# Patient Record
Sex: Male | Born: 1970 | Race: White | Hispanic: No | Marital: Married | Smoking: Former smoker
Health system: Southern US, Community
[De-identification: ages and names within clinical notes are randomized; demographics above are authoritative.]

## PROBLEM LIST (undated history)

## (undated) DIAGNOSIS — K219 Gastro-esophageal reflux disease without esophagitis: Secondary | ICD-10-CM

## (undated) DIAGNOSIS — Z87442 Personal history of urinary calculi: Secondary | ICD-10-CM

## (undated) DIAGNOSIS — G894 Chronic pain syndrome: Secondary | ICD-10-CM

## (undated) HISTORY — PX: LUMBAR DISC SURGERY: SHX700

## (undated) HISTORY — PX: HEMILAMINOTOMY LUMBAR SPINE: SUR654

## (undated) HISTORY — PX: HERNIA REPAIR: SHX51

---

## 2004-03-17 ENCOUNTER — Other Ambulatory Visit: Payer: Self-pay

## 2012-10-06 ENCOUNTER — Ambulatory Visit: Payer: Self-pay

## 2012-10-06 LAB — CBC WITH DIFFERENTIAL/PLATELET
Basophil #: 0 10*3/uL (ref 0.0–0.1)
Basophil %: 0.4 %
Eosinophil #: 0 10*3/uL (ref 0.0–0.7)
HCT: 44.5 % (ref 40.0–52.0)
Lymphocyte #: 1.3 10*3/uL (ref 1.0–3.6)
MCHC: 34.2 g/dL (ref 32.0–36.0)
Monocyte #: 1.5 x10 3/mm — ABNORMAL HIGH (ref 0.2–1.0)
Monocyte %: 13.1 %
Neutrophil #: 8.6 10*3/uL — ABNORMAL HIGH (ref 1.4–6.5)
Platelet: 175 10*3/uL (ref 150–440)
RDW: 13 % (ref 11.5–14.5)
WBC: 11.5 10*3/uL — ABNORMAL HIGH (ref 3.8–10.6)

## 2012-10-06 LAB — URINALYSIS, COMPLETE
Bilirubin,UR: NEGATIVE
Glucose,UR: NEGATIVE mg/dL (ref 0–75)
Ketone: NEGATIVE
Leukocyte Esterase: NEGATIVE
Ph: 6 (ref 4.5–8.0)
Protein: 30
RBC,UR: >30 /HPF (ref 0–5)

## 2012-10-06 LAB — COMPREHENSIVE METABOLIC PANEL
Albumin: 4 g/dL (ref 3.4–5.0)
Anion Gap: 12 (ref 7–16)
BUN: 10 mg/dL (ref 7–18)
Bilirubin,Total: 0.7 mg/dL (ref 0.2–1.0)
Chloride: 101 mmol/L (ref 98–107)
Creatinine: 1.17 mg/dL (ref 0.60–1.30)
EGFR (African American): >60
Glucose: 127 mg/dL — ABNORMAL HIGH (ref 65–99)
Osmolality: 278 (ref 275–301)
Potassium: 4 mmol/L (ref 3.5–5.1)
Sodium: 139 mmol/L (ref 136–145)
Total Protein: 7.9 g/dL (ref 6.4–8.2)

## 2012-10-06 LAB — AMYLASE: Amylase: 27 U/L (ref 25–115)

## 2012-10-07 ENCOUNTER — Emergency Department: Payer: Self-pay | Admitting: Emergency Medicine

## 2012-10-07 LAB — CBC WITH DIFFERENTIAL/PLATELET
Basophil #: 0 10*3/uL (ref 0.0–0.1)
Basophil %: 0.2 %
Eosinophil %: 0 %
HCT: 37.7 % — ABNORMAL LOW (ref 40.0–52.0)
Lymphocyte #: 1.2 10*3/uL (ref 1.0–3.6)
MCH: 32 pg (ref 26.0–34.0)
MCV: 89 fL (ref 80–100)
Monocyte %: 10.6 %
Neutrophil #: 7.6 10*3/uL — ABNORMAL HIGH (ref 1.4–6.5)
Platelet: 155 10*3/uL (ref 150–440)
RBC: 4.24 10*6/uL — ABNORMAL LOW (ref 4.40–5.90)

## 2012-10-07 LAB — COMPREHENSIVE METABOLIC PANEL
Anion Gap: 8 (ref 7–16)
Bilirubin,Total: 0.7 mg/dL (ref 0.2–1.0)
Chloride: 101 mmol/L (ref 98–107)
Co2: 24 mmol/L (ref 21–32)
Creatinine: 1.02 mg/dL (ref 0.60–1.30)
EGFR (African American): >60
EGFR (Non-African Amer.): >60
Osmolality: 268 (ref 275–301)
Potassium: 3.7 mmol/L (ref 3.5–5.1)
SGOT(AST): 32 U/L (ref 15–37)
SGPT (ALT): 42 U/L (ref 12–78)

## 2012-10-08 LAB — URINE CULTURE

## 2012-10-14 ENCOUNTER — Ambulatory Visit: Payer: Self-pay

## 2013-02-11 ENCOUNTER — Ambulatory Visit: Payer: Self-pay | Admitting: Family Medicine

## 2013-02-14 LAB — BETA STREP CULTURE(ARMC)

## 2013-04-03 IMAGING — CT CT STONE STUDY
1 of 2 series · 15 of 32 positions shown, 19 images · non-contrast
Comparison: none

REASON FOR EXAM: RLQ pain, hematuria, hx stone
COMMENTS:

[Series 2: soft tissue · axial · 0.69mm/px · z∈[-514,-64]mm · 15 of 164 slices shown, 19 images]
[im 7/164  soft-tissue]
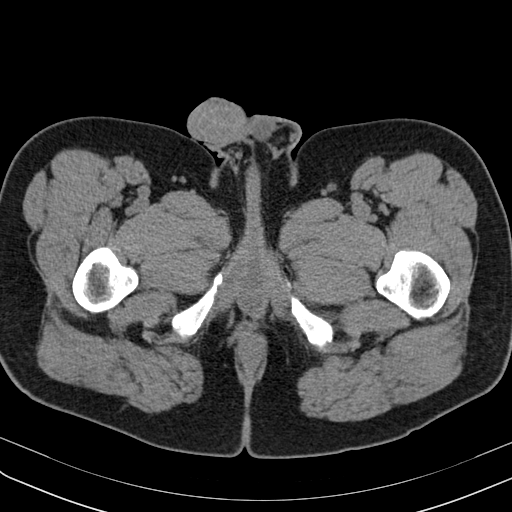
[im 7/164  bone]
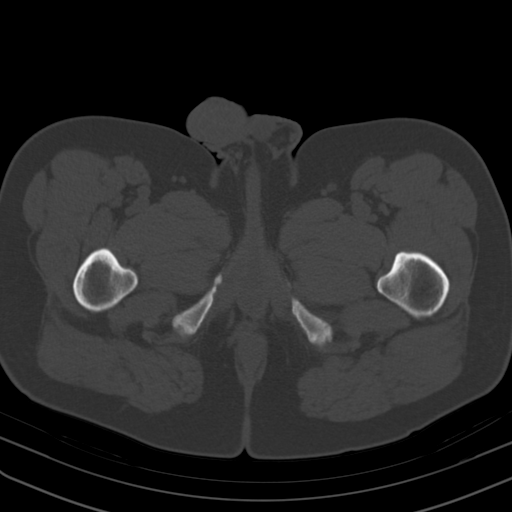
[im 21/164  soft-tissue]
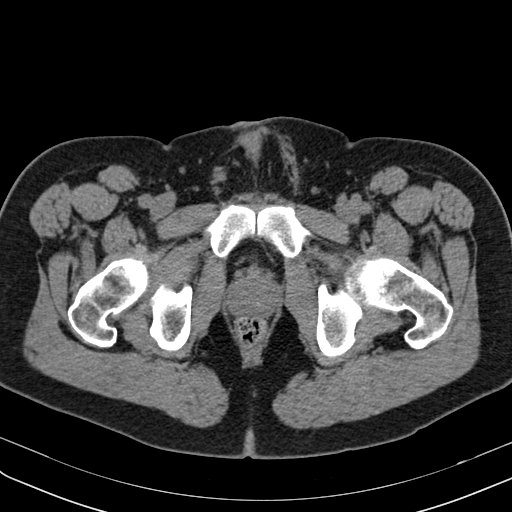
[im 34/164  soft-tissue]
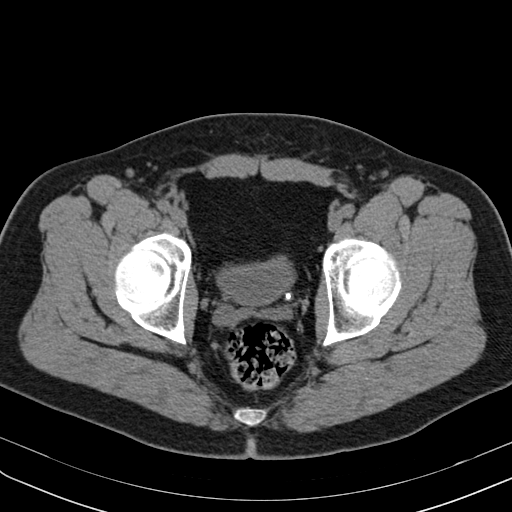
[im 48/164  soft-tissue]
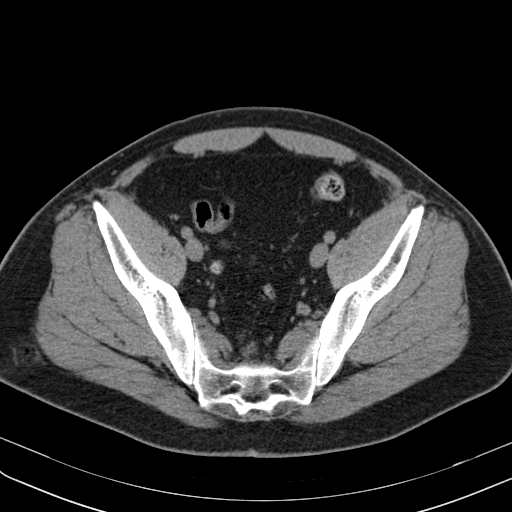
[im 55/164  soft-tissue]
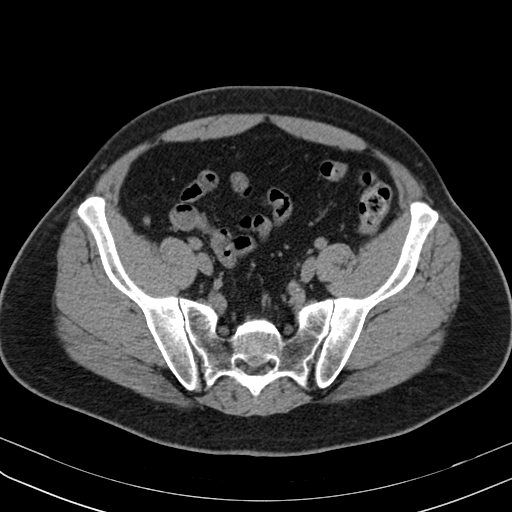
[im 68/164  soft-tissue]
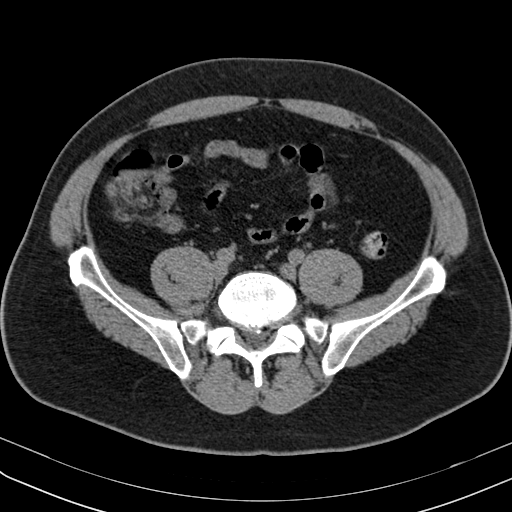
[im 82/164  soft-tissue]
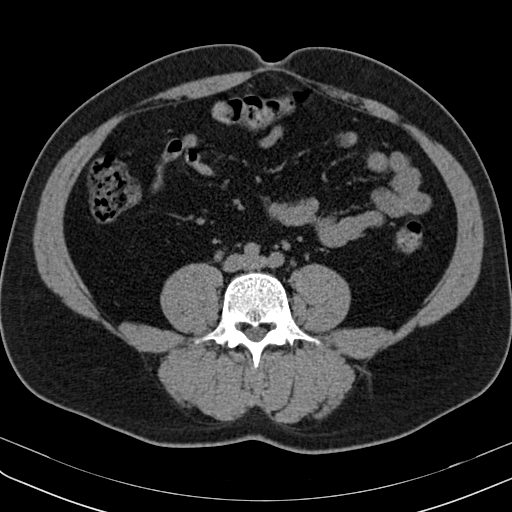
[im 96/164  soft-tissue]
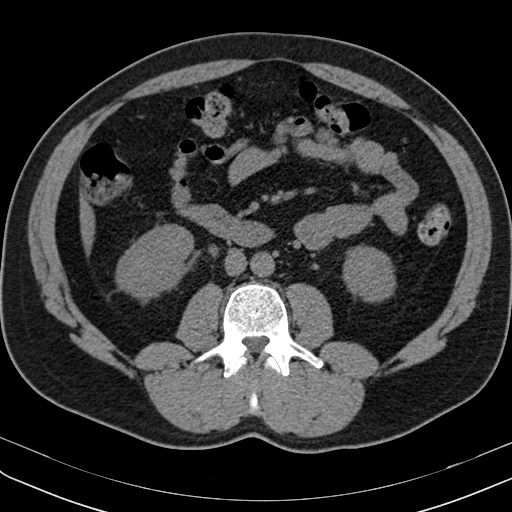
[im 109/164  soft-tissue]
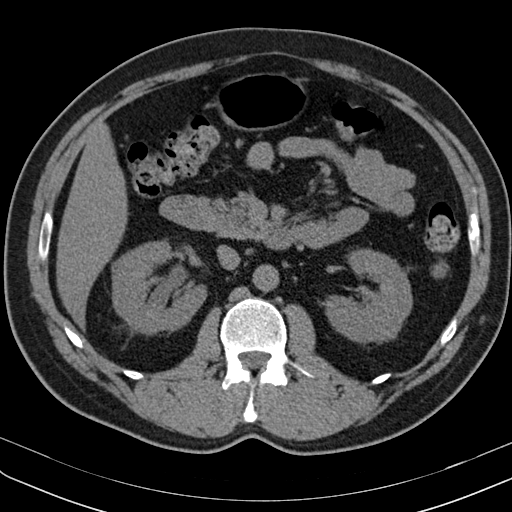
[im 109/164  bone]
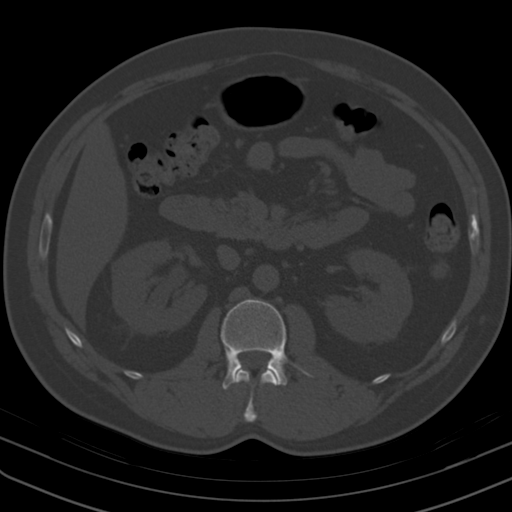
[im 116/164  soft-tissue]
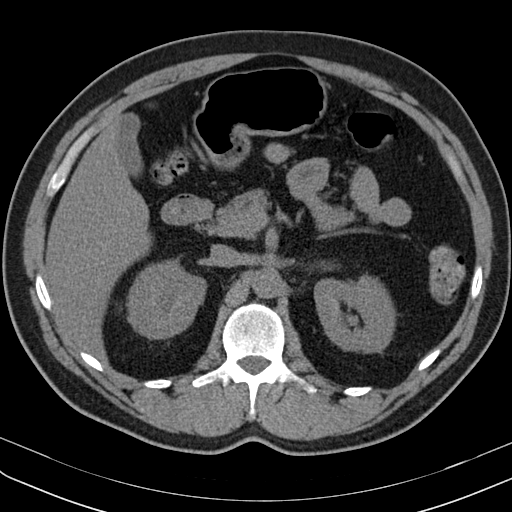
[im 130/164  soft-tissue]
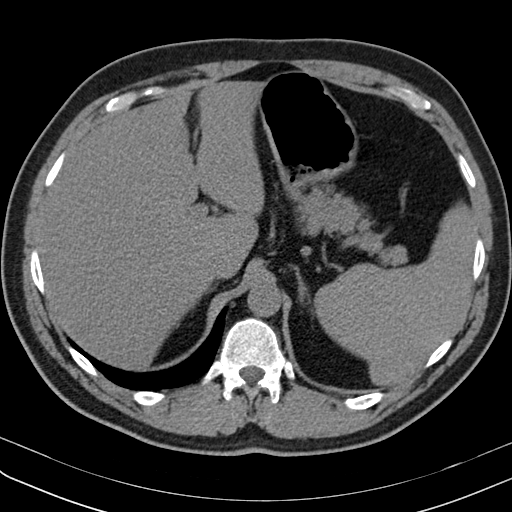
[im 136/164  lung]
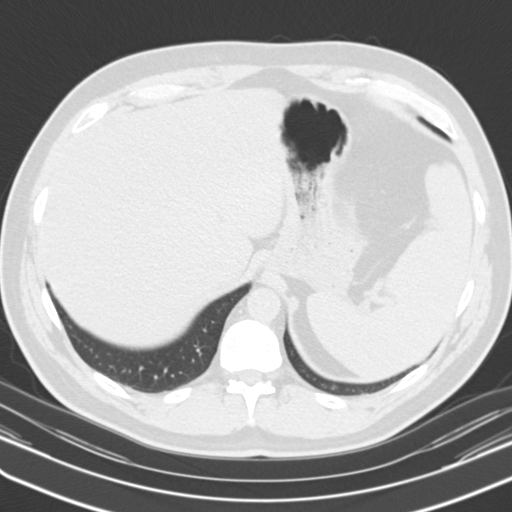
[im 143/164  soft-tissue]
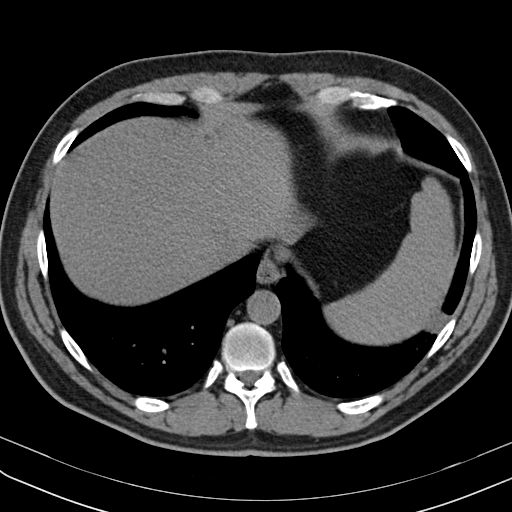
[im 143/164  lung]
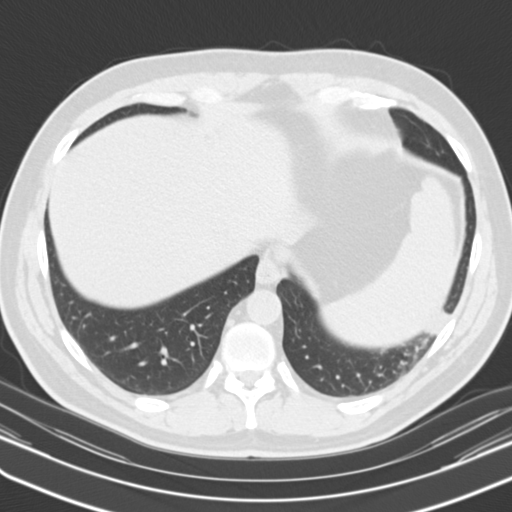
[im 150/164  lung]
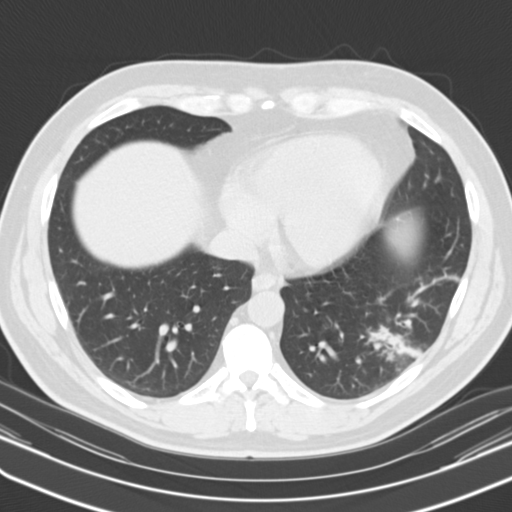
[im 157/164  soft-tissue]
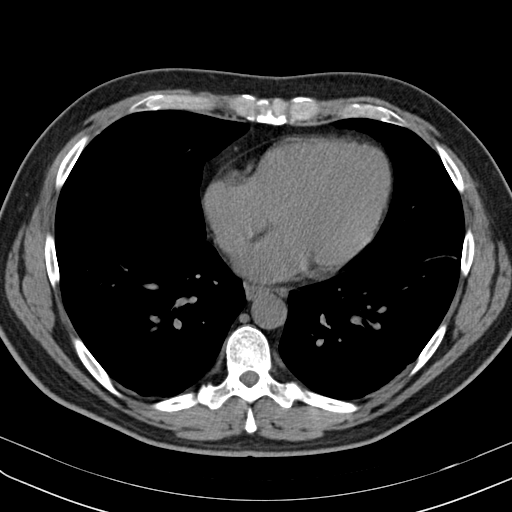
[im 157/164  lung]
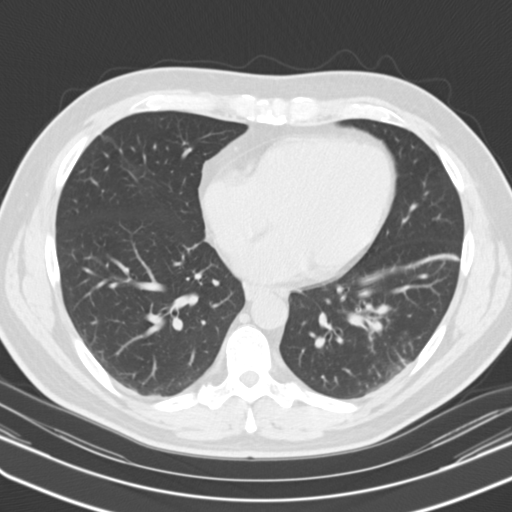

[15 of 32 positions shown; findings below may reference images not displayed]

PROCEDURE:     CHARPANTIER - CHARPANTIER ABDOMEN/PELVIS WO ( STONE)  - October 06, 2012  [DATE]

RESULT:     Axial noncontrast CT scanning was performed through the abdomen
and pelvis with reconstructions at 3 mm intervals and slice thicknesses.
Review of multiplanar reconstructed images was performed separately on the
VIA monitor.

There is mild hydronephrosis on the right secondary to a stone in the mid
ureter measuring 4 mm in diameter. There are nonobstructing 3 to 5 mm
diameter mid and lower pole stones elsewhere on the right. The perinephric
fat is normal in appearance. On the left there is no hydronephrosis. There
are stones measuring approximately 4 mm in diameter in the midpole. The
partially distended urinary bladder is normal in appearance. There is a
nonobstructing approximately 3 mm diameter stone at the left ureterovesical
junction. The prostate gland and seminal vesicles are normal in appearance.

The liver, gallbladder, pancreas, partially distended stomach, adrenal
glands, and periaortic and pericaval regions are normal in appearance. The
spleen is mildly enlarged measuring 15.9 cm AP x 10.5 cm transversely x 9 cm
in longitudinal dimension. The unopacified loops of small and large bowel
exhibit no evidence of ileus nor obstruction. A normal calibered appendix is
demonstrated.

The left lung base reveals patchy density suggesting atelectasis or
pneumonia. The lumbar vertebral bodies are preserved in height.
IMPRESSION: 1. There is mild hydronephrosis on the right secondary to a 4 mm diameter
stone at the junction of the proximal and mid ureter. There are other
similar size stones elsewhere in the right kidney not producing obstruction.
There are nonobstructing stones in the left kidney. There is a
nonobstructing 3 mm diameter left ureterovesical junction stone.
2. There is no evidence of acute hepatobiliary abnormality. There is
splenomegaly.
3. There is no acute bowel abnormality.
4. Patchy density in the lower lobe of the left lung laterally suggests
atelectasis or pneumonia.

[REDACTED]

## 2013-04-11 IMAGING — CR DG CHEST 2V
1 series · 2 of 2 positions shown · non-contrast
Comparison: none

REASON FOR EXAM: cough
COMMENTS:

PROCEDURE:     MDR - MDR CHEST PA(OR AP) AND LATERAL  - October 14, 2012  [DATE]
RESULT:     The lungs are clear. The heart and pulmonary vessels are normal.
The bony and mediastinal structures are unremarkable. There is no effusion.
There is no pneumothorax or evidence of congestive failure.

[Series 1: pa · 0.17mm/px · 2 of 2 slices shown]
[im 1/2]
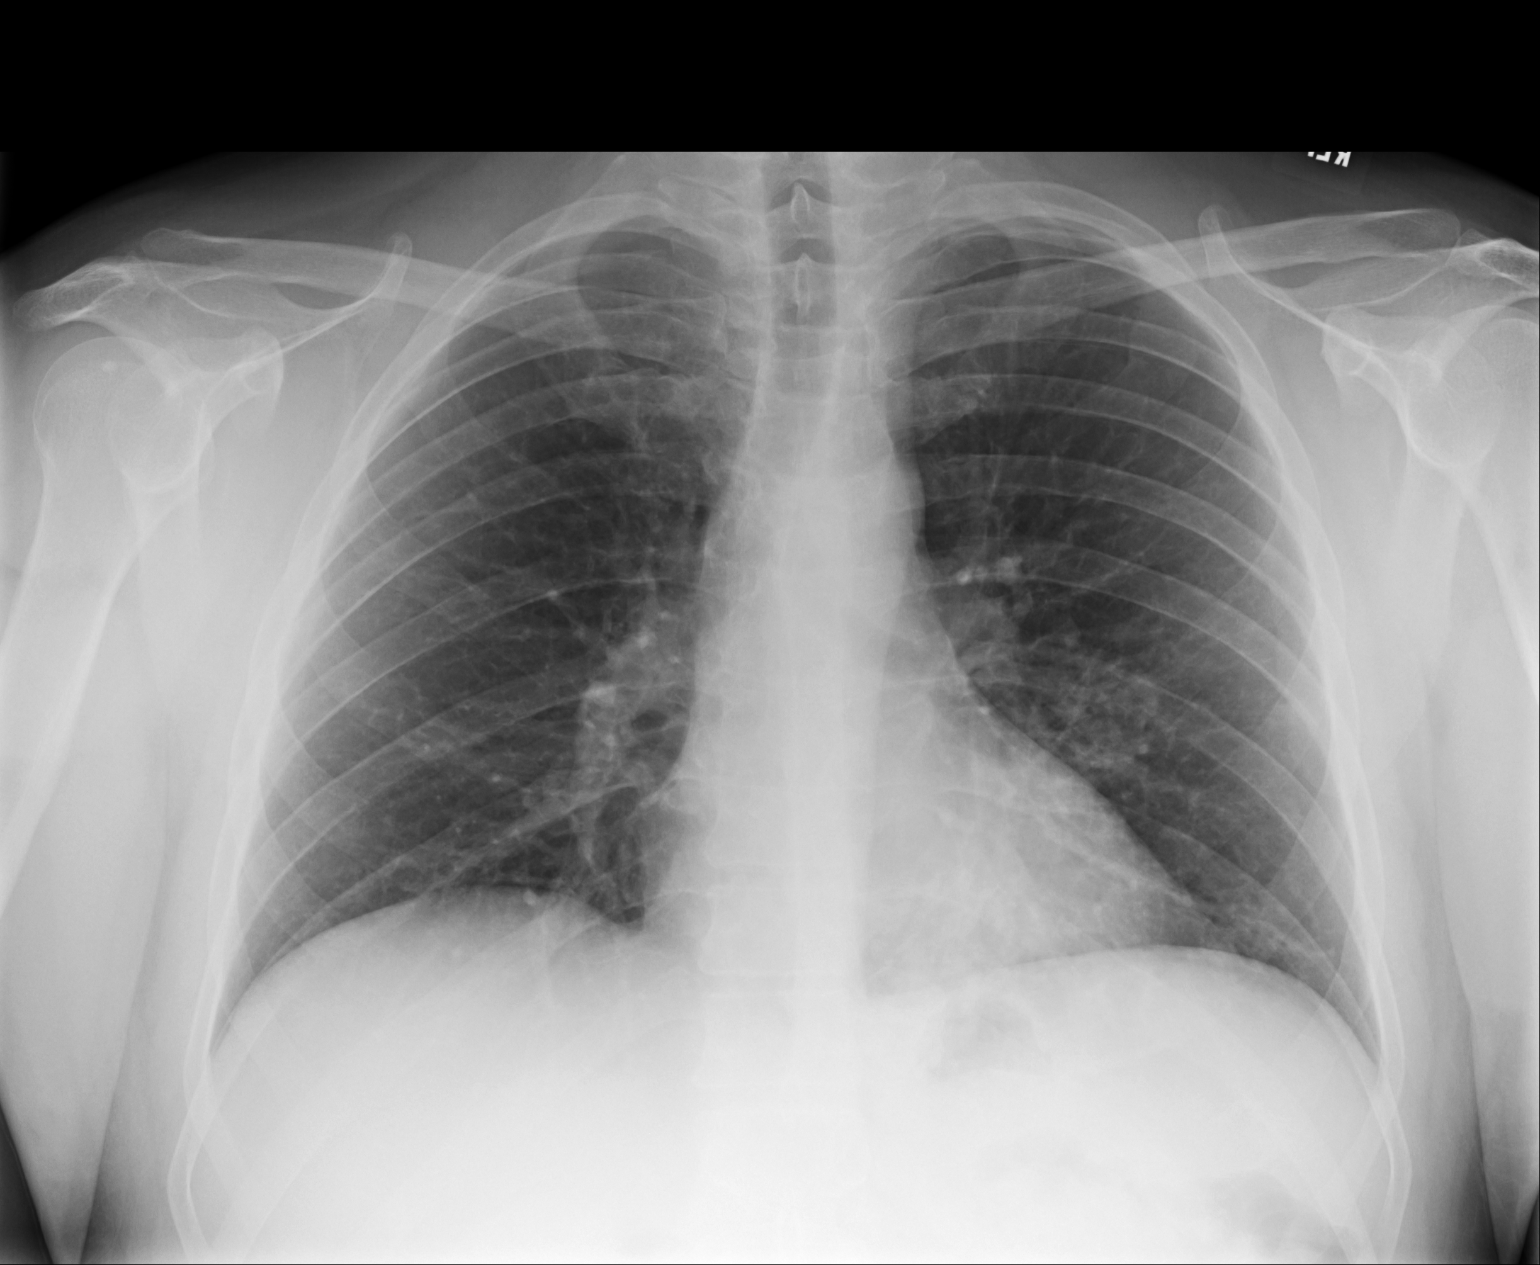
[im 2/2]
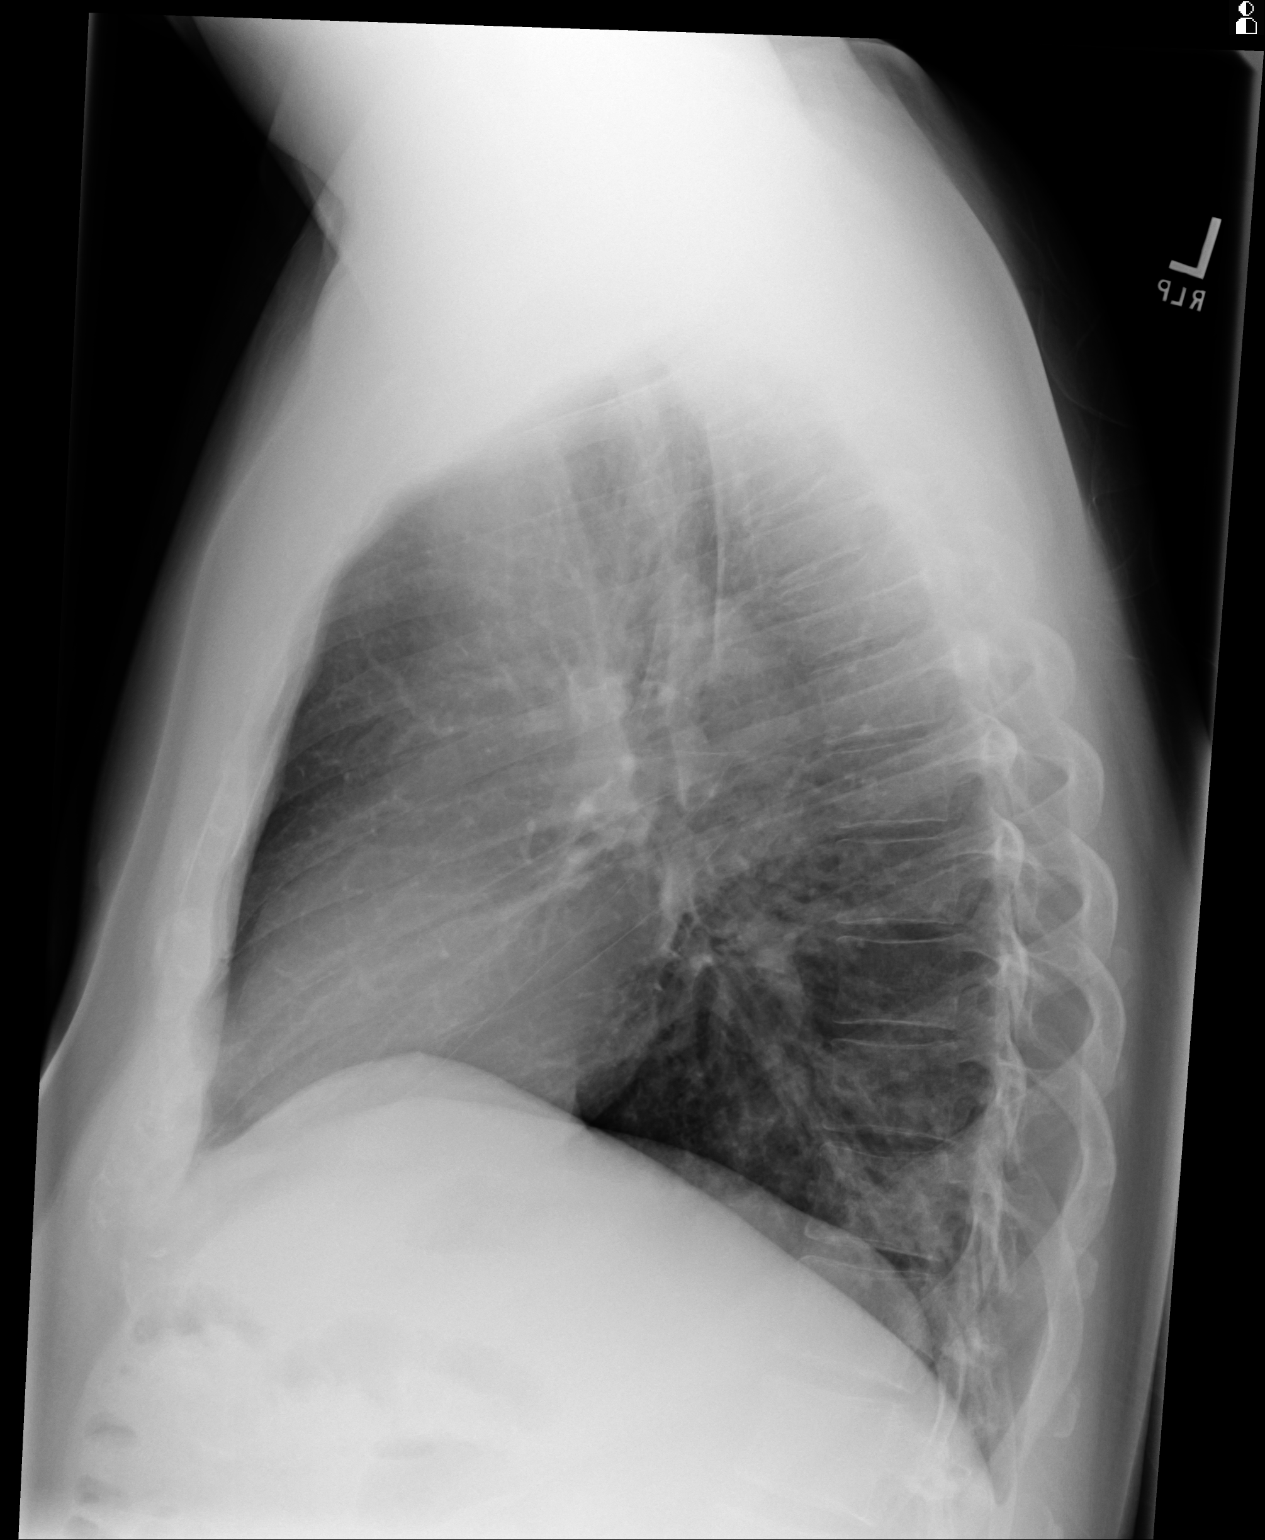

[2 of 2 positions shown; findings below may reference images not displayed]

IMPRESSION: No acute cardiopulmonary disease.

[REDACTED]

## 2014-04-26 ENCOUNTER — Ambulatory Visit: Payer: Self-pay | Admitting: Internal Medicine

## 2014-12-24 ENCOUNTER — Ambulatory Visit: Payer: Self-pay | Admitting: Physician Assistant

## 2015-04-21 ENCOUNTER — Ambulatory Visit (INDEPENDENT_AMBULATORY_CARE_PROVIDER_SITE_OTHER): Payer: 59 | Admitting: Unknown Physician Specialty

## 2015-04-21 ENCOUNTER — Encounter: Payer: Self-pay | Admitting: Unknown Physician Specialty

## 2015-04-21 VITALS — BP 134/83 | HR 71 | Temp 98.3°F | Ht 68.0 in | Wt 189.2 lb

## 2015-04-21 DIAGNOSIS — G894 Chronic pain syndrome: Secondary | ICD-10-CM | POA: Diagnosis not present

## 2015-04-21 DIAGNOSIS — F32A Depression, unspecified: Secondary | ICD-10-CM

## 2015-04-21 DIAGNOSIS — F329 Major depressive disorder, single episode, unspecified: Secondary | ICD-10-CM | POA: Diagnosis not present

## 2015-04-21 DIAGNOSIS — F419 Anxiety disorder, unspecified: Secondary | ICD-10-CM

## 2015-04-21 DIAGNOSIS — G8929 Other chronic pain: Secondary | ICD-10-CM | POA: Insufficient documentation

## 2015-04-21 DIAGNOSIS — M549 Dorsalgia, unspecified: Secondary | ICD-10-CM | POA: Diagnosis not present

## 2015-04-21 NOTE — Patient Instructions (Signed)
[employer].  Put in zip code and insurance.

## 2015-04-21 NOTE — Assessment & Plan Note (Signed)
Feeling hopeless.  Not ready for counseling at this time but we discussed it.

## 2015-04-21 NOTE — Assessment & Plan Note (Addendum)
Continue present medications.  Refer to Dr. Dossie Arbour as pt would like to consider other options including another opinion about a spinal cord stimulator

## 2015-04-21 NOTE — Progress Notes (Addendum)
BP 134/83 mmHg  Pulse 71  Temp(Src) 98.3 F (36.8 C)  Ht 5\' 8"  (1.727 m)  Wt 189 lb 3.2 oz (85.821 kg)  BMI 28.77 kg/m2  SpO2 98%   Subjective:    Patient ID: William Herring, male    DOB: July 06, 1971, 44 y.o.   MRN: [id number]  HPI: William Herring is a 44 y.o. male  Chief Complaint  Patient presents with  . Pain    Relevant past medical, surgical, family and social history reviewed and updated as indicated. Interim medical history since our last visit reviewed. Allergies and medications reviewed and updated.  Patient requesting refill of pain medication. Has been treated by [facility] in [place].  His is staus post Hemilaminectomy and discectomy about 1 year ago.  He currently reports pain radiating from buttocks to the back of his knee. Describes it as a sharp burning pain.  He is also taking Gabapentin which helps to "dull it."  He is not sure of the dose.  In addition, he takes Vicoden 7.5mg /300 TID.  Has a temporary spinal cord stimulator which hs helped some. Nerve pain in leg is better.   Discussed with pain clinic a permanent spinal cord stimular which patient is stil "thinking about."  He  Doesn't want to be out of work for 6 weeks.    Depression: Pt reports complete lack of motivation.  Probably secondary to pain.  Pt is not open to counseling at this time.    Review of Systems  Per HPI unless specifically indicated above     Objective:    BP 134/83 mmHg  Pulse 71  Temp(Src) 98.3 F (36.8 C)  Ht 5\' 8"  (1.727 m)  Wt 189 lb 3.2 oz (85.821 kg)  BMI 28.77 kg/m2  SpO2 98%  Wt Readings from Last 3 Encounters:  04/21/15 189 lb 3.2 oz (85.821 kg)  02/03/15 179 lb (81.194 kg)    Physical Exam  Constitutional: He is oriented to person, place, and time. He appears well-developed and well-nourished. No distress.  HENT:  Head: Normocephalic and atraumatic.  Eyes: Conjunctivae and lids are normal. Right eye exhibits no discharge. Left eye exhibits  no discharge. No scleral icterus.  Cardiovascular: Normal rate and regular rhythm.   Pulmonary/Chest: Effort normal. No respiratory distress.  Abdominal: Normal appearance. He exhibits no distension. There is no splenomegaly or hepatomegaly. There is no tenderness.  Musculoskeletal: Normal range of motion.  Neurological: He is alert and oriented to person, place, and time.  Skin: Skin is intact. No rash noted. No pallor.  Psychiatric: He has a normal mood and affect. His behavior is normal. Judgment and thought content normal.       Assessment & Plan:   Problem List Items Addressed This Visit      Other   Chronic back pain - Primary    Continue present medications.  Refer to Dr. Dossie Arbour as pt would like to consider other options including another opinion about a spinal cord stimulator      Relevant Orders   Ambulatory referral to Pain Clinic   Chronic pain syndrome   Relevant Orders   Ambulatory referral to Pain Clinic   Anxiety   Depression    Feeling hopeless.  Not ready for counseling at this time but we discussed it.          Pt is on a 28 day cycle for his pain medications.    Follow up plan: Return if symptoms worsen or  fail to improve.

## 2015-04-23 ENCOUNTER — Other Ambulatory Visit: Payer: Self-pay | Admitting: Unknown Physician Specialty

## 2015-04-23 MED ORDER — HYDROCODONE-ACETAMINOPHEN 7.5-325 MG PO TABS: 1.0000 | ORAL_TABLET | Freq: Three times a day (TID) | ORAL | Status: DC

## 2015-04-28 ENCOUNTER — Other Ambulatory Visit: Payer: Self-pay | Admitting: Unknown Physician Specialty

## 2015-04-28 MED ORDER — CLONAZEPAM 0.5 MG PO TABS: 0.5000 mg | ORAL_TABLET | Freq: Every day | ORAL | Status: DC

## 2015-05-21 ENCOUNTER — Other Ambulatory Visit: Payer: Self-pay | Admitting: Unknown Physician Specialty

## 2015-05-21 MED ORDER — HYDROCODONE-ACETAMINOPHEN 7.5-325 MG PO TABS: 1.0000 | ORAL_TABLET | Freq: Three times a day (TID) | ORAL | Status: DC

## 2015-05-25 ENCOUNTER — Ambulatory Visit
Admission: EM | Admit: 2015-05-25 | Discharge: 2015-05-25 | Disposition: A | Discharge status: Home / Self Care | Payer: 59

## 2015-05-25 NOTE — ED Notes (Signed)
Pt. Requesting "Cortisone injection" as had done here previously. Informed that neither [facility] feels comfortable injecting the patient and instructed to follow up with Primary Care doctor

## 2015-05-26 ENCOUNTER — Other Ambulatory Visit: Payer: Self-pay | Admitting: Unknown Physician Specialty

## 2015-05-26 MED ORDER — CITALOPRAM HYDROBROMIDE 20 MG PO TABS: 20.0000 mg | ORAL_TABLET | Freq: Every day | ORAL | Status: DC

## 2015-05-26 MED ORDER — CLONAZEPAM 0.5 MG PO TABS: 0.5000 mg | ORAL_TABLET | Freq: Every day | ORAL | Status: DC

## 2015-05-31 ENCOUNTER — Ambulatory Visit (INDEPENDENT_AMBULATORY_CARE_PROVIDER_SITE_OTHER): Payer: 59 | Admitting: Unknown Physician Specialty

## 2015-05-31 ENCOUNTER — Encounter: Payer: Self-pay | Admitting: Unknown Physician Specialty

## 2015-05-31 VITALS — BP 116/80 | HR 73 | Temp 98.6°F | Ht 68.5 in | Wt 185.6 lb

## 2015-05-31 DIAGNOSIS — M7711 Lateral epicondylitis, right elbow: Secondary | ICD-10-CM | POA: Diagnosis not present

## 2015-05-31 MED ORDER — METHYLPREDNISOLONE 4 MG PO TBPK: ORAL_TABLET | ORAL | Status: DC

## 2015-05-31 MED ORDER — DICLOFENAC SODIUM 1 % TD GEL: 4.0000 g | Freq: Four times a day (QID) | TRANSDERMAL | Status: DC

## 2015-05-31 NOTE — Patient Instructions (Signed)
Tennis Elbow Your caregiver has diagnosed you with a condition often referred to as "tennis elbow." This results from small tears or soreness (inflammation) at the start (origin) of the extensor muscles of the forearm. Although the condition is often called tennis or golfer's elbow, it is caused by any repetitive action performed by your elbow. HOME CARE INSTRUCTIONS  If the condition has been short lived, rest may be the only treatment required. Using your opposite hand or arm to perform the task may help. Even changing your grip may help rest the extremity. These may even prevent the condition from recurring.  Longer standing problems, however, will often be relieved faster by:  Using anti-inflammatory agents.  Applying ice packs for 30 minutes at the end of the working day, at bed time, or when activities are finished.  Your caregiver may also have you wear a splint or sling. This will allow the inflamed tendon to heal. At times, steroid injections aided with a local anesthetic will be required along with splinting for 1 to 2 weeks. Two to three steroid injections will often solve the problem. In some long standing cases, the inflamed tendon does not respond to conservative (non-surgical) therapy. Then surgery may be required to repair it. MAKE SURE YOU:   Understand these instructions.  Will watch your condition.  Will get help right away if you are not doing well or get worse. Document Released: 10/23/2005 Document Revised: 01/15/2012 Document Reviewed: 06/10/2008 Exit[employer] Patient Information 2015 [employer]. This information is not intended to replace advice given to you by your health care provider. Make sure you discuss any questions you have with your health care provider.  

## 2015-05-31 NOTE — Progress Notes (Signed)
   BP 116/80 mmHg  Pulse 73  Temp(Src) 98.6 F (37 C)  Ht 5' 8.5" (1.74 m)  Wt 185 lb 9.6 oz (84.188 kg)  BMI 27.81 kg/m2  SpO2 96%   Subjective:    Patient ID: William Herring, male    DOB: December 31, 1970, 44 y.o.   MRN: [id number]  HPI: William Herring is a 44 y.o. male  Chief Complaint  Patient presents with  . Arm Pain    pt states elbow on right arm is in pain. Pt states he got a cortizone injection back in February and has not had any problems since then until now   HPI   Elbow pain Pain has been ongoing for 6 months.  Went to urgent care 5 months ago.  They gave him a cortisone injection.  He started having trouble with it 2 weeks ago.  No contributing, worsening , or alleviating factors.  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    Relevant past medical, surgical, family and social history reviewed and updated as indicated. Interim medical history since our last visit reviewed. Allergies and medications reviewed and updated.  Review of Systems  Per HPI unless specifically indicated above     Objective:    BP 116/80 mmHg  Pulse 73  Temp(Src) 98.6 F (37 C)  Ht 5' 8.5" (1.74 m)  Wt 185 lb 9.6 oz (84.188 kg)  BMI 27.81 kg/m2  SpO2 96%  Wt Readings from Last 3 Encounters:  05/31/15 185 lb 9.6 oz (84.188 kg)  04/21/15 189 lb 3.2 oz (85.821 kg)  02/03/15 179 lb (81.194 kg)    Physical Exam  Constitutional: He is oriented to person, place, and time. He appears well-developed and well-nourished. No distress.  HENT:  Head: Normocephalic and atraumatic.  Eyes: Conjunctivae and lids are normal. Right eye exhibits no discharge. Left eye exhibits no discharge. No scleral icterus.  Pulmonary/Chest: Effort normal. No respiratory distress.  Abdominal: Normal appearance. There is no splenomegaly or hepatomegaly.  Musculoskeletal: Normal range of motion.       Right elbow: Tenderness found. Medial epicondyle tenderness noted.   Neurological: He is alert and oriented to person, place, and time.  Skin: Skin is intact. No rash noted. No pallor.  Psychiatric: He has a normal mood and affect. His behavior is normal. Judgment and thought content normal.         Assessment & Plan:   Problem List Items Addressed This Visit    None    Visit Diagnoses    Tennis elbow, right    -  Primary    Rx for Voltaren Gel.  Pt ed on cost.  Rx medrol dose pack.  Use tennis elbow splint.  Exercises deomonstrated.      Relevant Medications    methylPREDNISolone (MEDROL DOSEPAK) 4 MG TBPK tablet       Consider referral for injection  Follow up plan: Return if symptoms worsen or fail to improve.

## 2015-06-16 ENCOUNTER — Telehealth: Payer: Self-pay | Admitting: Unknown Physician Specialty

## 2015-06-16 NOTE — Telephone Encounter (Signed)
E-fax came through for refill on: Rx: citalopram (CELEXA) 20 MG tablet Copy in basket.

## 2015-06-16 NOTE — Telephone Encounter (Signed)
This was refilled back on 05/26/15. Called pharmacy and they stated it was sent in error.

## 2015-06-21 ENCOUNTER — Other Ambulatory Visit: Payer: Self-pay | Admitting: Unknown Physician Specialty

## 2015-06-21 MED ORDER — CITALOPRAM HYDROBROMIDE 20 MG PO TABS: 20.0000 mg | ORAL_TABLET | Freq: Every day | ORAL | Status: DC

## 2015-06-21 MED ORDER — HYDROCODONE-ACETAMINOPHEN 7.5-325 MG PO TABS: 1.0000 | ORAL_TABLET | Freq: Three times a day (TID) | ORAL | Status: DC

## 2015-06-21 MED ORDER — CLONAZEPAM 0.5 MG PO TABS: 0.5000 mg | ORAL_TABLET | Freq: Every day | ORAL | Status: DC

## 2015-06-24 DIAGNOSIS — M5116 Intervertebral disc disorders with radiculopathy, lumbar region: Secondary | ICD-10-CM | POA: Insufficient documentation

## 2015-07-16 ENCOUNTER — Other Ambulatory Visit: Payer: Self-pay | Admitting: Unknown Physician Specialty

## 2015-07-16 MED ORDER — HYDROCODONE-ACETAMINOPHEN 7.5-325 MG PO TABS: 1.0000 | ORAL_TABLET | Freq: Four times a day (QID) | ORAL | Status: DC | PRN

## 2015-07-16 MED ORDER — CLONAZEPAM 0.5 MG PO TABS: 0.5000 mg | ORAL_TABLET | Freq: Every day | ORAL | Status: DC

## 2015-07-16 MED ORDER — HYDROCODONE-ACETAMINOPHEN 5-325 MG PO TABS
1.0000 | ORAL_TABLET | Freq: Four times a day (QID) | ORAL | Status: DC | PRN
Start: 2015-07-16 — End: 2015-07-16

## 2015-07-16 NOTE — Progress Notes (Signed)
Continuing taper of opioid rx.  Dropped from 7.5 mg to 5 mg

## 2015-07-21 ENCOUNTER — Telehealth: Payer: Self-pay | Admitting: Unknown Physician Specialty

## 2015-07-21 NOTE — Telephone Encounter (Signed)
Pt called stated his RX for hydrocodon is incorrect, it should be 84 but pt only received 56. Please call pt to inform why the amount was reduced. Thanks.

## 2015-07-21 NOTE — Telephone Encounter (Signed)
Please let patient know that William Herring is out of the office and will be available to address his question next week; then forward to [facility] please

## 2015-07-21 NOTE — Telephone Encounter (Signed)
Called and left patient a voicemail stating that Dr. Sanda Klein said that it was best if we wait for William Herring to return to address this. I told the patient that William Herring will not return until Monday so I would let him know something as soon as she lets me know something. I also told him to give [place] a call back if he has any other problems or questions in the meantime.

## 2015-07-21 NOTE — Telephone Encounter (Signed)
Routing to provider  

## 2015-07-25 NOTE — Telephone Encounter (Signed)
My records for some reason had him down as #56.  Please ask him when he will run out and we will put him on a new schedule of #84 and will ask Barnett Applebaum to change the week.    Thanks

## 2015-07-26 NOTE — Telephone Encounter (Signed)
Tried to call patient but it said that their were network difficulties and to try again later. No voicemail was available so I will try again later.

## 2015-07-27 NOTE — Telephone Encounter (Signed)
Tried to call patient but the phone never rang, just keep beeping. Will try again later.

## 2015-07-28 NOTE — Telephone Encounter (Signed)
Called and left patient a voicemail stating that Malachy Mood had him down for #56 instead of #84. I asked for him to please call me back and let me know when he will run out so we can get him on a new schedule with Gina. I told the patient he could leave it on my voicemail if I could not answer.

## 2015-08-03 ENCOUNTER — Telehealth: Payer: Self-pay

## 2015-08-03 MED ORDER — HYDROCODONE-ACETAMINOPHEN 7.5-325 MG PO TABS: 1.0000 | ORAL_TABLET | Freq: Four times a day (QID) | ORAL | Status: DC | PRN

## 2015-08-03 NOTE — Telephone Encounter (Signed)
Called and let the patient know that the rx was re-wrote and ready for him to pick up at the front desk.

## 2015-08-03 NOTE — Telephone Encounter (Signed)
Patient returned my call about the hydrocodone being on the wrong schedule. Patient was given #56 on 07/16/15 and he states he was supposed to get #84. Patient stated that he will run out in the next few days so he needs a new RX written for #84. This is all documented in another encounter in the patient's chart because he never returned my call last week so please refer to that encounter.

## 2015-08-03 NOTE — Telephone Encounter (Signed)
I wrote another prescription for #84.  thanks

## 2015-08-18 ENCOUNTER — Other Ambulatory Visit: Payer: Self-pay | Admitting: Unknown Physician Specialty

## 2015-08-18 MED ORDER — HYDROCODONE-ACETAMINOPHEN 7.5-325 MG PO TABS: 1.0000 | ORAL_TABLET | Freq: Four times a day (QID) | ORAL | Status: DC | PRN

## 2015-08-18 MED ORDER — CLONAZEPAM 0.5 MG PO TABS: 0.5000 mg | ORAL_TABLET | Freq: Every day | ORAL | Status: DC

## 2015-08-19 ENCOUNTER — Other Ambulatory Visit: Payer: Self-pay | Admitting: Family Medicine

## 2015-08-19 MED ORDER — CLONAZEPAM 0.5 MG PO TABS: 0.5000 mg | ORAL_TABLET | Freq: Every day | ORAL | Status: DC

## 2015-09-24 ENCOUNTER — Other Ambulatory Visit: Payer: Self-pay | Admitting: Unknown Physician Specialty

## 2015-09-24 MED ORDER — CLONAZEPAM 0.5 MG PO TABS: 0.5000 mg | ORAL_TABLET | Freq: Every day | ORAL | Status: DC

## 2015-09-24 MED ORDER — HYDROCODONE-ACETAMINOPHEN 7.5-325 MG PO TABS: 1.0000 | ORAL_TABLET | Freq: Four times a day (QID) | ORAL | Status: DC | PRN

## 2015-10-14 ENCOUNTER — Encounter: Payer: Self-pay | Admitting: Unknown Physician Specialty

## 2015-10-18 ENCOUNTER — Other Ambulatory Visit: Payer: Self-pay | Admitting: Unknown Physician Specialty

## 2015-10-18 DIAGNOSIS — Z79891 Long term (current) use of opiate analgesic: Secondary | ICD-10-CM

## 2015-10-18 MED ORDER — CLONAZEPAM 0.5 MG PO TABS: 0.5000 mg | ORAL_TABLET | Freq: Every day | ORAL | Status: DC

## 2015-10-18 MED ORDER — HYDROCODONE-ACETAMINOPHEN 7.5-325 MG PO TABS: 1.0000 | ORAL_TABLET | Freq: Four times a day (QID) | ORAL | Status: DC | PRN

## 2015-10-25 ENCOUNTER — Ambulatory Visit: Payer: 59 | Admitting: Unknown Physician Specialty

## 2015-10-27 ENCOUNTER — Ambulatory Visit: Payer: 59 | Admitting: Unknown Physician Specialty

## 2015-10-27 ENCOUNTER — Other Ambulatory Visit: Payer: 59

## 2015-10-27 DIAGNOSIS — Z79891 Long term (current) use of opiate analgesic: Secondary | ICD-10-CM

## 2015-11-02 LAB — URINE DRUGS OF ABUSE SCREEN W ALC, ROUTINE (REF LAB)
Amphetamines, Urine: NEGATIVE ng/mL
BARBITURATE QUANT UR: NEGATIVE ng/mL
Benzodiazepine Quant, Ur: NEGATIVE ng/mL
Cannabinoid Quant, Ur: NEGATIVE ng/mL
Cocaine (Metab.): NEGATIVE ng/mL
ETHANOL U, QUAN: NEGATIVE %
METHADONE SCREEN, URINE: NEGATIVE ng/mL
PCP Quant, Ur: NEGATIVE ng/mL
PROPOXYPHENE: NEGATIVE ng/mL

## 2015-11-02 LAB — OPIATES CONFIRMATION, URINE: OPIATES: NEGATIVE

## 2015-11-09 ENCOUNTER — Telehealth: Payer: Self-pay | Admitting: Unknown Physician Specialty

## 2015-11-09 NOTE — Telephone Encounter (Signed)
Patient is traveling to [place] and wants to know if he is up to date on his immunizations. Please call him with this information, thanks.

## 2015-11-09 NOTE — Telephone Encounter (Signed)
On the CDC website you look under travel medicine.  The requirements may change based on which part of [place] he is going to.  Ask Izora Gala as Dr. Jeananne Rama often sends them to a travel clinic

## 2015-11-09 NOTE — Telephone Encounter (Signed)
Called and gave patient directions for his vaccines.

## 2015-11-09 NOTE — Telephone Encounter (Signed)
Routing to provider. Patient is up to date on tetanus, has no documentation of flu for this season. What information can I give this patient?

## 2015-11-17 ENCOUNTER — Other Ambulatory Visit: Payer: Self-pay

## 2015-11-17 MED ORDER — CITALOPRAM HYDROBROMIDE 20 MG PO TABS: 20.0000 mg | ORAL_TABLET | Freq: Every day | ORAL | Status: DC

## 2015-11-17 NOTE — Telephone Encounter (Signed)
Patient was last seen 04/21/15. Pharmacy is [facility] and they are requesting a 90 day supply of medication.

## 2015-11-19 ENCOUNTER — Other Ambulatory Visit: Payer: Self-pay | Admitting: Unknown Physician Specialty

## 2015-11-19 MED ORDER — CLONAZEPAM 0.5 MG PO TABS: 0.5000 mg | ORAL_TABLET | Freq: Every day | ORAL | Status: DC

## 2015-11-19 MED ORDER — HYDROCODONE-ACETAMINOPHEN 7.5-325 MG PO TABS: 1.0000 | ORAL_TABLET | Freq: Four times a day (QID) | ORAL | Status: DC | PRN

## 2015-12-17 ENCOUNTER — Other Ambulatory Visit: Payer: Self-pay | Admitting: Unknown Physician Specialty

## 2015-12-17 MED ORDER — HYDROCODONE-ACETAMINOPHEN 7.5-325 MG PO TABS: 1.0000 | ORAL_TABLET | Freq: Four times a day (QID) | ORAL | Status: DC | PRN

## 2015-12-17 MED ORDER — CLONAZEPAM 0.5 MG PO TABS: 0.5000 mg | ORAL_TABLET | Freq: Every day | ORAL | Status: DC

## 2016-01-03 ENCOUNTER — Ambulatory Visit (INDEPENDENT_AMBULATORY_CARE_PROVIDER_SITE_OTHER): Payer: 59 | Admitting: Unknown Physician Specialty

## 2016-01-03 ENCOUNTER — Encounter: Payer: Self-pay | Admitting: Unknown Physician Specialty

## 2016-01-03 VITALS — BP 133/86 | HR 67 | Temp 98.3°F | Ht 67.0 in | Wt 188.0 lb

## 2016-01-03 DIAGNOSIS — F329 Major depressive disorder, single episode, unspecified: Secondary | ICD-10-CM

## 2016-01-03 DIAGNOSIS — R208 Other disturbances of skin sensation: Secondary | ICD-10-CM | POA: Diagnosis not present

## 2016-01-03 DIAGNOSIS — Z760 Encounter for issue of repeat prescription: Secondary | ICD-10-CM

## 2016-01-03 DIAGNOSIS — R200 Anesthesia of skin: Secondary | ICD-10-CM

## 2016-01-03 DIAGNOSIS — F32A Depression, unspecified: Secondary | ICD-10-CM

## 2016-01-03 NOTE — Assessment & Plan Note (Signed)
Patient here for med refill. Patient not interested in counseling at this time. Continue present medications.

## 2016-01-03 NOTE — Progress Notes (Signed)
BP 133/86 mmHg  Pulse 67  Temp(Src) 98.3 F (36.8 C)  Ht 5\' 7"  (1.702 m)  Wt 188 lb (85.276 kg)  BMI 29.44 kg/m2  SpO2 96%   Subjective:    Patient ID: William Herring, male    DOB: [birth year], 45 y.o.   MRN: [id number]  HPI: William Herring is a 45 y.o. male  Chief Complaint  Patient presents with  . Numbness    pt states he has been having painful numbness in both legs for a few weeks now  . Depression   Med Refill: Patient here for 28 day med refill of clonazepam 0.5mg   and hydrocodone-acetaminophen 7.5-325mg .   Depression: Patient has lack of motivation likely due to chronic pain. Here for med refill. Not interested in counseling.   Neuropathy: He describes symptoms of tingling, painful numbness in bilateral lower legs and feet. Onset of symptoms was a week ago. Symptoms are currently of 2/10 severity. Symptoms occur intermittently and patient unsure of how long it lasts. Has not tried anything for symptoms and states worse when laying in certain positions. Has relief when stands up and moves around. Denies any weakness, edema.   Relevant past medical, surgical, family and social history reviewed and updated as indicated. Interim medical history since our last visit reviewed. Allergies and medications reviewed and updated.  Review of Systems  Constitutional: Negative.   HENT: Negative.   Eyes: Negative.   Respiratory: Negative.   Cardiovascular: Negative.   Gastrointestinal: Negative.   Endocrine: Negative.   Genitourinary: Negative.   Musculoskeletal:       Chronic back pain  Skin: Negative.   Allergic/Immunologic: Negative.   Neurological: Positive for numbness.  Hematological: Negative.   Psychiatric/Behavioral: Negative.     Per HPI unless specifically indicated above     Objective:    BP 133/86 mmHg  Pulse 67  Temp(Src) 98.3 F (36.8 C)  Ht 5\' 7"  (1.702 m)  Wt 188 lb (85.276 kg)  BMI 29.44 kg/m2  SpO2 96%  Wt Readings from Last 3  Encounters:  01/03/16 188 lb (85.276 kg)  05/31/15 185 lb 9.6 oz (84.188 kg)  04/21/15 189 lb 3.2 oz (85.821 kg)    Physical Exam  Constitutional: He is oriented to person, place, and time. He appears well-developed and well-nourished. No distress.  HENT:  Head: Normocephalic and atraumatic.  Eyes: Conjunctivae and lids are normal. Right eye exhibits no discharge. Left eye exhibits no discharge. No scleral icterus.  Neck: Normal range of motion. Neck supple. No JVD present. Carotid bruit is not present.  Cardiovascular: Normal rate, regular rhythm and normal heart sounds.   Pulmonary/Chest: Effort normal and breath sounds normal. No respiratory distress.  Abdominal: Normal appearance. There is no splenomegaly or hepatomegaly.  Musculoskeletal: Normal range of motion.  Neurological: He is alert and oriented to person, place, and time.  Skin: Skin is warm, dry and intact. No rash noted. No pallor.  Psychiatric: He has a normal mood and affect. His behavior is normal. Judgment and thought content normal.    Results for orders placed or performed in visit on 10/27/15  Drugs of abuse scrn w alc, routine urine  Result Value Ref Range   Amphetamines, Urine Negative Cutoff=1000 ng/mL   Barbiturate Quant, Ur Negative Cutoff=300 ng/mL   Benzodiazepine Quant, Ur Negative Cutoff=300 ng/mL   Cannabinoid Quant, Ur Negative Cutoff=50 ng/mL   Cocaine (Metab.) Negative Cutoff=300 ng/mL   Opiate Quant, Ur See Final Results Cutoff=300 ng/mL  PCP Quant, Ur Negative Cutoff=25 ng/mL   Methadone Screen, Urine Negative Cutoff=300 ng/mL   Propoxyphene Negative Cutoff=300 ng/mL   Ethanol U, Quan Negative Cutoff=0.020 %  Opiates Confirmation, Urine  Result Value Ref Range   OPIATES Negative Cutoff=300      Assessment & Plan:   Problem List Items Addressed This Visit      Unprioritized   Depression    Patient here for med refill. Patient not interested in counseling at this time. Continue present  medications.        Other Visit Diagnoses    Numbness of both lower extremities    -  Primary    Refer patient to back surgeon due to chronic back issues. Patient states he will follow up with surgeon himself.     Encounter for medication refill        Refill for clonazepam 0.5mg  and hydrocodone-acetaminophen 7.5-325 mg.         Follow up plan: Return in about 3 months (around 04/01/2016).

## 2016-01-14 ENCOUNTER — Other Ambulatory Visit: Payer: Self-pay | Admitting: Unknown Physician Specialty

## 2016-01-14 MED ORDER — HYDROCODONE-ACETAMINOPHEN 7.5-325 MG PO TABS: 1.0000 | ORAL_TABLET | Freq: Four times a day (QID) | ORAL | Status: DC | PRN

## 2016-01-14 MED ORDER — CLONAZEPAM 0.5 MG PO TABS: 0.5000 mg | ORAL_TABLET | Freq: Every day | ORAL | Status: DC

## 2016-02-07 ENCOUNTER — Telehealth: Payer: Self-pay

## 2016-02-07 MED ORDER — HYDROCODONE-ACETAMINOPHEN 7.5-325 MG PO TABS: 1.0000 | ORAL_TABLET | Freq: Four times a day (QID) | ORAL | Status: DC | PRN

## 2016-02-07 MED ORDER — CLONAZEPAM 0.5 MG PO TABS: 0.5000 mg | ORAL_TABLET | Freq: Every day | ORAL | Status: DC

## 2016-02-07 NOTE — Telephone Encounter (Signed)
Patient called and stated that both of his 28 day prescriptions will run out next Wednesday but he will be out of the country on vacation. Patient wants to know if we could write them early so he doesn't run out while he is gone.

## 2016-02-07 NOTE — Telephone Encounter (Signed)
OK.  Written

## 2016-03-07 ENCOUNTER — Other Ambulatory Visit: Payer: Self-pay | Admitting: Unknown Physician Specialty

## 2016-03-07 MED ORDER — CLONAZEPAM 0.5 MG PO TABS: 0.5000 mg | ORAL_TABLET | Freq: Every day | ORAL | Status: DC

## 2016-03-07 MED ORDER — HYDROCODONE-ACETAMINOPHEN 7.5-325 MG PO TABS: 1.0000 | ORAL_TABLET | Freq: Four times a day (QID) | ORAL | Status: DC | PRN

## 2016-04-04 ENCOUNTER — Other Ambulatory Visit: Payer: Self-pay | Admitting: Unknown Physician Specialty

## 2016-04-04 MED ORDER — HYDROCODONE-ACETAMINOPHEN 7.5-325 MG PO TABS: 1.0000 | ORAL_TABLET | Freq: Four times a day (QID) | ORAL | Status: DC | PRN

## 2016-04-04 MED ORDER — CLONAZEPAM 0.5 MG PO TABS: 0.5000 mg | ORAL_TABLET | Freq: Every day | ORAL | Status: DC

## 2016-04-11 ENCOUNTER — Ambulatory Visit: Payer: 59 | Admitting: Unknown Physician Specialty

## 2016-04-19 ENCOUNTER — Encounter: Payer: Self-pay | Admitting: Unknown Physician Specialty

## 2016-04-19 ENCOUNTER — Ambulatory Visit (INDEPENDENT_AMBULATORY_CARE_PROVIDER_SITE_OTHER): Payer: 59 | Admitting: Unknown Physician Specialty

## 2016-04-19 VITALS — BP 125/85 | HR 82 | Temp 98.3°F | Ht 68.3 in | Wt 195.6 lb

## 2016-04-19 DIAGNOSIS — R5382 Chronic fatigue, unspecified: Secondary | ICD-10-CM

## 2016-04-19 DIAGNOSIS — F32A Depression, unspecified: Secondary | ICD-10-CM

## 2016-04-19 DIAGNOSIS — Z0000 Encounter for general adult medical examination without abnormal findings: Secondary | ICD-10-CM | POA: Diagnosis not present

## 2016-04-19 DIAGNOSIS — G894 Chronic pain syndrome: Secondary | ICD-10-CM

## 2016-04-19 DIAGNOSIS — F329 Major depressive disorder, single episode, unspecified: Secondary | ICD-10-CM

## 2016-04-19 DIAGNOSIS — R5383 Other fatigue: Secondary | ICD-10-CM | POA: Insufficient documentation

## 2016-04-19 MED ORDER — CITALOPRAM HYDROBROMIDE 40 MG PO TABS: 40.0000 mg | ORAL_TABLET | Freq: Every day | ORAL | Status: DC

## 2016-04-19 NOTE — Assessment & Plan Note (Signed)
Stable, continue present medications.   

## 2016-04-19 NOTE — Progress Notes (Signed)
BP 125/85 mmHg  Pulse 82  Temp(Src) 98.3 F (36.8 C)  Ht 5' 8.3" (1.735 m)  Wt 195 lb 9.6 oz (88.724 kg)  BMI 29.47 kg/m2  SpO2 95%   Subjective:    Patient ID: William Herring, male    DOB: 1971-08-24, 45 y.o.   MRN: [id number]  HPI: William Herring is a 45 y.o. male  Chief Complaint  Patient presents with  . Depression  . Medication Refill   1.  Chronic Pain  Patient is under mchronic opioid management for care for pain medication. Has been treated by [facility] in [place].  His is staus post Hemilaminectomy and discectomy about 1 year ago.  He currently reports pain radiating from buttocks to the back of his knee. Describes it as a sharp burning pain.  He is also taking Gabapentin which helps to "dull it."  He is not sure of the dose.  In addition, he takes Vicoden 7.5mg /300 TID.  Has a temporary spinal cord stimulator which hs helped some. Nerve pain in leg is better.  Still thinking of getting a spinal cord stimulator   Location:  back  H1 Current pain level:  6/10 H3 Aggravating factors:  sitting and being stationary  H7 Alleviating factors: activity and certain positions H7 Benefit from narcotic medications:  yes  H6   Interested in weaning off narcotics:  no H6   Previous pain specialty evaluation:  yes  H6 Non-narcotic analgesic meds :  yes  H6   Narcotic contract:  yes  H6     Depression Seems to be worsening.  No suicidal ideation.  Mostly lack of motivation and feels hopeless and fatigued.  Currently taking Citalopram 20 mg.  We have tried multiple medications.   Depression screen [facility] 2/9 04/19/2016 01/03/2016  Decreased Interest 3 3  Down, Depressed, Hopeless 2 2  PHQ - 2 Score 5 5  Altered sleeping 1 0  Tired, decreased energy 3 2  Change in appetite 2 1  Feeling bad or failure about yourself  2 1  Trouble concentrating 0 -  Moving slowly or fidgety/restless 0 0  Suicidal thoughts 0 0  PHQ-9 Score 13 9      Relevant past medical, surgical,  family and social history reviewed and updated as indicated. Interim medical history since our last visit reviewed. Allergies and medications reviewed and updated.  Review of Systems  Per HPI unless specifically indicated above     Objective:    BP 125/85 mmHg  Pulse 82  Temp(Src) 98.3 F (36.8 C)  Ht 5' 8.3" (1.735 m)  Wt 195 lb 9.6 oz (88.724 kg)  BMI 29.47 kg/m2  SpO2 95%  Wt Readings from Last 3 Encounters:  04/19/16 195 lb 9.6 oz (88.724 kg)  01/03/16 188 lb (85.276 kg)  05/31/15 185 lb 9.6 oz (84.188 kg)    Physical Exam  Constitutional: He is oriented to person, place, and time. He appears well-developed and well-nourished. No distress.  HENT:  Head: Normocephalic and atraumatic.  Eyes: Conjunctivae and lids are normal. Right eye exhibits no discharge. Left eye exhibits no discharge. No scleral icterus.  Neck: Normal range of motion. Neck supple. No JVD present. Carotid bruit is not present.  Cardiovascular: Normal rate, regular rhythm and normal heart sounds.   Pulmonary/Chest: Effort normal and breath sounds normal. No respiratory distress.  Abdominal: Normal appearance. There is no splenomegaly or hepatomegaly.  Musculoskeletal: Normal range of motion.  Neurological: He is alert and  oriented to person, place, and time.  Skin: Skin is warm, dry and intact. No rash noted. No pallor.  Psychiatric: He has a normal mood and affect. His behavior is normal. Judgment and thought content normal.      Assessment & Plan:   Problem List Items Addressed This Visit      Unprioritized   Chronic pain syndrome - Primary    Stable, continue present medications.        Depression    Increase Citalopram to 40 mg daily.  Check labs that can contribute to fatigue and depression      Fatigue   Relevant Orders   Comprehensive metabolic panel   CBC with Differential/Platelet   TSH   VITAMIN D 25 Hydroxy (Vit-D Deficiency, Fractures)   Vitamin B12    Other Visit Diagnoses     Routine general medical examination at a health care facility        Relevant Orders    Lipid Panel w/o Chol/HDL Ratio        Follow up plan: Return in about 6 weeks (around 05/31/2016).

## 2016-04-19 NOTE — Assessment & Plan Note (Signed)
Increase Citalopram to 40 mg daily.  Check labs that can contribute to fatigue and depression

## 2016-04-20 LAB — CBC WITH DIFFERENTIAL/PLATELET
BASOS: 1 %
Basophils Absolute: 0 10*3/uL (ref 0.0–0.2)
EOS (ABSOLUTE): 0.1 10*3/uL (ref 0.0–0.4)
EOS: 2 %
HEMATOCRIT: 43.1 % (ref 37.5–51.0)
HEMOGLOBIN: 14.8 g/dL (ref 12.6–17.7)
IMMATURE GRANS (ABS): 0 10*3/uL (ref 0.0–0.1)
Immature Granulocytes: 0 %
LYMPHS: 51 %
Lymphocytes Absolute: 3.2 10*3/uL — ABNORMAL HIGH (ref 0.7–3.1)
MCH: 31.2 pg (ref 26.6–33.0)
MCHC: 34.3 g/dL (ref 31.5–35.7)
MCV: 91 fL (ref 79–97)
Monocytes Absolute: 0.5 10*3/uL (ref 0.1–0.9)
Monocytes: 8 %
NEUTROS ABS: 2.4 10*3/uL (ref 1.4–7.0)
NEUTROS PCT: 38 %
Platelets: 208 10*3/uL (ref 150–379)
RBC: 4.74 x10E6/uL (ref 4.14–5.80)
RDW: 13.4 % (ref 12.3–15.4)
WBC: 6.3 10*3/uL (ref 3.4–10.8)

## 2016-04-20 LAB — COMPREHENSIVE METABOLIC PANEL
ALT: 32 IU/L (ref 0–44)
AST: 28 IU/L (ref 0–40)
Albumin/Globulin Ratio: 1.8 (ref 1.2–2.2)
Albumin: 4.6 g/dL (ref 3.5–5.5)
Alkaline Phosphatase: 63 IU/L (ref 39–117)
BUN/Creatinine Ratio: 13 (ref 9–20)
BUN: 11 mg/dL (ref 6–24)
Bilirubin Total: 0.4 mg/dL (ref 0.0–1.2)
CALCIUM: 9.7 mg/dL (ref 8.7–10.2)
CO2: 26 mmol/L (ref 18–29)
CREATININE: 0.82 mg/dL (ref 0.76–1.27)
Chloride: 100 mmol/L (ref 96–106)
GFR, EST AFRICAN AMERICAN: 124 mL/min/{1.73_m2} (ref >59)
GFR, EST NON AFRICAN AMERICAN: 108 mL/min/{1.73_m2} (ref >59)
GLOBULIN, TOTAL: 2.6 g/dL (ref 1.5–4.5)
Glucose: 90 mg/dL (ref 65–99)
Potassium: 4.2 mmol/L (ref 3.5–5.2)
Sodium: 141 mmol/L (ref 134–144)
TOTAL PROTEIN: 7.2 g/dL (ref 6.0–8.5)

## 2016-04-20 LAB — LIPID PANEL W/O CHOL/HDL RATIO
CHOLESTEROL TOTAL: 176 mg/dL (ref 100–199)
HDL: 28 mg/dL — AB (ref >39)
LDL CALC: 85 mg/dL (ref 0–99)
TRIGLYCERIDES: 315 mg/dL — AB (ref 0–149)
VLDL CHOLESTEROL CAL: 63 mg/dL — AB (ref 5–40)

## 2016-04-20 LAB — TSH: TSH: 1.02 u[IU]/mL (ref 0.450–4.500)

## 2016-04-20 LAB — VITAMIN D 25 HYDROXY (VIT D DEFICIENCY, FRACTURES): Vit D, 25-Hydroxy: 30.3 ng/mL (ref 30.0–100.0)

## 2016-04-20 LAB — VITAMIN B12: Vitamin B-12: 319 pg/mL (ref 211–946)

## 2016-04-21 ENCOUNTER — Encounter: Payer: Self-pay | Admitting: Unknown Physician Specialty

## 2016-04-21 NOTE — Progress Notes (Signed)
Quick Note:  Normal labs. Pt notified through mychart ______ 

## 2016-04-28 ENCOUNTER — Other Ambulatory Visit: Payer: Self-pay | Admitting: Unknown Physician Specialty

## 2016-04-28 MED ORDER — HYDROCODONE-ACETAMINOPHEN 7.5-325 MG PO TABS: 1.0000 | ORAL_TABLET | Freq: Four times a day (QID) | ORAL | Status: DC | PRN

## 2016-04-28 MED ORDER — CLONAZEPAM 0.5 MG PO TABS: 0.5000 mg | ORAL_TABLET | Freq: Every day | ORAL | Status: DC

## 2016-05-17 ENCOUNTER — Other Ambulatory Visit: Payer: Self-pay | Admitting: Unknown Physician Specialty

## 2016-05-31 ENCOUNTER — Encounter: Payer: Self-pay | Admitting: Unknown Physician Specialty

## 2016-05-31 ENCOUNTER — Ambulatory Visit (INDEPENDENT_AMBULATORY_CARE_PROVIDER_SITE_OTHER): Payer: 59 | Admitting: Unknown Physician Specialty

## 2016-05-31 DIAGNOSIS — F32A Depression, unspecified: Secondary | ICD-10-CM

## 2016-05-31 DIAGNOSIS — G894 Chronic pain syndrome: Secondary | ICD-10-CM

## 2016-05-31 DIAGNOSIS — F329 Major depressive disorder, single episode, unspecified: Secondary | ICD-10-CM | POA: Diagnosis not present

## 2016-05-31 MED ORDER — BUPROPION HCL ER (XL) 150 MG PO TB24: 150.0000 mg | ORAL_TABLET | Freq: Every day | ORAL | 1 refills | Status: DC

## 2016-05-31 MED ORDER — HYDROCODONE-ACETAMINOPHEN 7.5-325 MG PO TABS: 1.0000 | ORAL_TABLET | Freq: Four times a day (QID) | ORAL | 0 refills | Status: DC | PRN

## 2016-05-31 MED ORDER — CLONAZEPAM 0.5 MG PO TABS: 0.5000 mg | ORAL_TABLET | Freq: Every day | ORAL | 0 refills | Status: DC

## 2016-05-31 NOTE — Assessment & Plan Note (Signed)
No better or worse.  Continue present meds

## 2016-05-31 NOTE — Progress Notes (Signed)
BP 129/86 (BP Location: Left Arm, Patient Position: Sitting, Cuff Size: Large)   Pulse 67   Temp 98.2 F (36.8 C)   Ht 5' 8.6" (1.742 m)   Wt 193 lb 6.4 oz (87.7 kg)   SpO2 97%   BMI 28.89 kg/m    Subjective:    Patient ID: William Herring, male    DOB: Feb 01, 1971, 45 y.o.   MRN: [id number]  HPI: William Herring is a 45 y.o. male  Chief Complaint  Patient presents with  . Depression    pt states he started taking the citalopram 20 mg instead of 40 because he didn't like the way it made him feel    Depression States the 40 mg of Citalopram of 40 was "too much" so he cut back to 20 mg.  "so I'm back where I was." Depression screen [facility] 2/9 05/31/2016 04/19/2016 01/03/2016  Decreased Interest 2 3 3   Down, Depressed, Hopeless 2 2 2   PHQ - 2 Score 4 5 5   Altered sleeping 0 1 0  Tired, decreased energy 1 3 2   Change in appetite 3 2 1   Feeling bad or failure about yourself  2 2 1   Trouble concentrating 0 0 -  Moving slowly or fidgety/restless 0 0 0  Suicidal thoughts 0 0 0  PHQ-9 Score 10 13 9    1.  Chronic Pain  Patient is under mchronic opioid management for care for pain medication. Has been treated by [facility] in [place].  His is staus post Hemilaminectomy and discectomy about 1 year ago.  He currently reports pain radiating from buttocks to the back of his knee. Describes it as a sharp burning pain.  He is also taking Gabapentin which helps to "dull it."  He is not sure of the dose.  In addition, he takes Vicoden 7.5mg /300 TID.  Has a temporary spinal cord stimulator which hs helped some. Nerve pain in leg is better.  Still thinking of getting a spinal cord stimulator   Location:  back  H1 Current pain level:  6/10 H3 Aggravating factors:  sitting and being stationary  H7 Alleviating factors: activity and certain positions H7 Benefit from narcotic medications:  yes  H6   Interested in weaning off narcotics:  no H6   Previous pain specialty evaluation:  yes   H6 Non-narcotic analgesic meds :  yes  H6   Narcotic contract:  yes  H6        Relevant past medical, surgical, family and social history reviewed and updated as indicated. Interim medical history since our last visit reviewed. Allergies and medications reviewed and updated.  Review of Systems  Per HPI unless specifically indicated above     Objective:    BP 129/86 (BP Location: Left Arm, Patient Position: Sitting, Cuff Size: Large)   Pulse 67   Temp 98.2 F (36.8 C)   Ht 5' 8.6" (1.742 m)   Wt 193 lb 6.4 oz (87.7 kg)   SpO2 97%   BMI 28.89 kg/m   Wt Readings from Last 3 Encounters:  05/31/16 193 lb 6.4 oz (87.7 kg)  04/19/16 195 lb 9.6 oz (88.7 kg)  01/03/16 188 lb (85.3 kg)    Physical Exam  Constitutional: He is oriented to person, place, and time. He appears well-developed and well-nourished. No distress.  HENT:  Head: Normocephalic and atraumatic.  Eyes: Conjunctivae and lids are normal. Right eye exhibits no discharge. Left eye exhibits no discharge. No scleral icterus.  Neck: Normal range of motion. Neck supple. No JVD present. Carotid bruit is not present.  Cardiovascular: Normal rate, regular rhythm and normal heart sounds.   Pulmonary/Chest: Effort normal and breath sounds normal. No respiratory distress.  Abdominal: Normal appearance. There is no splenomegaly or hepatomegaly.  Musculoskeletal: Normal range of motion.  Neurological: He is alert and oriented to person, place, and time.  Skin: Skin is warm, dry and intact. No rash noted. No pallor.  Psychiatric: He has a normal mood and affect. His behavior is normal. Judgment and thought content normal.    Results for orders placed or performed in visit on 04/19/16  Comprehensive metabolic panel  Result Value Ref Range   Glucose 90 65 - 99 mg/dL   BUN 11 6 - 24 mg/dL   Creatinine, Ser 0.82 0.76 - 1.27 mg/dL   GFR calc non Af Amer 108 >59 mL/min/1.73   GFR calc Af Amer 124 >59 mL/min/1.73   BUN/Creatinine  Ratio 13 9 - 20   Sodium 141 134 - 144 mmol/L   Potassium 4.2 3.5 - 5.2 mmol/L   Chloride 100 96 - 106 mmol/L   CO2 26 18 - 29 mmol/L   Calcium 9.7 8.7 - 10.2 mg/dL   Total Protein 7.2 6.0 - 8.5 g/dL   Albumin 4.6 3.5 - 5.5 g/dL   Globulin, Total 2.6 1.5 - 4.5 g/dL   Albumin/Globulin Ratio 1.8 1.2 - 2.2   Bilirubin Total 0.4 0.0 - 1.2 mg/dL   Alkaline Phosphatase 63 39 - 117 IU/L   AST 28 0 - 40 IU/L   ALT 32 0 - 44 IU/L  CBC with Differential/Platelet  Result Value Ref Range   WBC 6.3 3.4 - 10.8 x10E3/uL   RBC 4.74 4.14 - 5.80 x10E6/uL   Hemoglobin 14.8 12.6 - 17.7 g/dL   Hematocrit 43.1 37.5 - 51.0 %   MCV 91 79 - 97 fL   MCH 31.2 26.6 - 33.0 pg   MCHC 34.3 31.5 - 35.7 g/dL   RDW 13.4 12.3 - 15.4 %   Platelets 208 150 - 379 x10E3/uL   Neutrophils 38 %   Lymphs 51 %   Monocytes 8 %   Eos 2 %   Basos 1 %   Neutrophils Absolute 2.4 1.4 - 7.0 x10E3/uL   Lymphocytes Absolute 3.2 (H) 0.7 - 3.1 x10E3/uL   Monocytes Absolute 0.5 0.1 - 0.9 x10E3/uL   EOS (ABSOLUTE) 0.1 0.0 - 0.4 x10E3/uL   Basophils Absolute 0.0 0.0 - 0.2 x10E3/uL   Immature Granulocytes 0 %   Immature Grans (Abs) 0.0 0.0 - 0.1 x10E3/uL  TSH  Result Value Ref Range   TSH 1.020 0.450 - 4.500 uIU/mL  VITAMIN D 25 Hydroxy (Vit-D Deficiency, Fractures)  Result Value Ref Range   Vit D, 25-Hydroxy 30.3 30.0 - 100.0 ng/mL  Lipid Panel w/o Chol/HDL Ratio  Result Value Ref Range   Cholesterol, Total 176 100 - 199 mg/dL   Triglycerides 315 (H) 0 - 149 mg/dL   HDL 28 (L) >39 mg/dL   VLDL Cholesterol Cal 63 (H) 5 - 40 mg/dL   LDL Calculated 85 0 - 99 mg/dL  Vitamin B12  Result Value Ref Range   Vitamin B-12 319 211 - 946 pg/mL      Assessment & Plan:   Problem List Items Addressed This Visit      Unprioritized   Chronic pain syndrome    No better or worse.  Continue present meds  Depression    Not controlled.  Add Buproprion 150 mg in the AM      Relevant Medications   buPROPion (WELLBUTRIN XL)  150 MG 24 hr tablet    Other Visit Diagnoses   None.      Follow up plan: Return in about 4 weeks (around 06/28/2016).

## 2016-05-31 NOTE — Assessment & Plan Note (Signed)
Not controlled.  Add Buproprion 150 mg in the AM

## 2016-06-09 ENCOUNTER — Other Ambulatory Visit: Payer: Self-pay | Admitting: Unknown Physician Specialty

## 2016-06-09 MED ORDER — HYDROCODONE-ACETAMINOPHEN 7.5-325 MG PO TABS: 1.0000 | ORAL_TABLET | Freq: Four times a day (QID) | ORAL | 0 refills | Status: DC | PRN

## 2016-06-09 MED ORDER — CLONAZEPAM 0.5 MG PO TABS: 0.5000 mg | ORAL_TABLET | Freq: Every day | ORAL | 0 refills | Status: DC

## 2016-07-03 ENCOUNTER — Other Ambulatory Visit: Payer: Self-pay | Admitting: Unknown Physician Specialty

## 2016-07-03 NOTE — Telephone Encounter (Signed)
Your patient 

## 2016-07-04 ENCOUNTER — Ambulatory Visit: Payer: 59 | Admitting: Unknown Physician Specialty

## 2016-07-20 ENCOUNTER — Other Ambulatory Visit: Payer: Self-pay | Admitting: Unknown Physician Specialty

## 2016-07-20 MED ORDER — CLONAZEPAM 0.5 MG PO TABS: 0.5000 mg | ORAL_TABLET | Freq: Every day | ORAL | 0 refills | Status: DC

## 2016-07-20 MED ORDER — HYDROCODONE-ACETAMINOPHEN 7.5-325 MG PO TABS: 1.0000 | ORAL_TABLET | Freq: Four times a day (QID) | ORAL | 0 refills | Status: DC | PRN

## 2016-08-02 ENCOUNTER — Ambulatory Visit (INDEPENDENT_AMBULATORY_CARE_PROVIDER_SITE_OTHER): Payer: 59 | Admitting: Unknown Physician Specialty

## 2016-08-02 ENCOUNTER — Encounter: Payer: Self-pay | Admitting: Unknown Physician Specialty

## 2016-08-02 DIAGNOSIS — G8929 Other chronic pain: Secondary | ICD-10-CM

## 2016-08-02 DIAGNOSIS — M549 Dorsalgia, unspecified: Secondary | ICD-10-CM

## 2016-08-02 DIAGNOSIS — F32A Depression, unspecified: Secondary | ICD-10-CM

## 2016-08-02 DIAGNOSIS — F329 Major depressive disorder, single episode, unspecified: Secondary | ICD-10-CM | POA: Diagnosis not present

## 2016-08-02 MED ORDER — VORTIOXETINE HBR 20 MG PO TABS: 20.0000 mg | ORAL_TABLET | Freq: Every day | ORAL | 1 refills | Status: DC

## 2016-08-02 NOTE — Progress Notes (Signed)
BP 132/83 (BP Location: Left Arm, Patient Position: Sitting, Cuff Size: Large)   Pulse 72   Temp 98.3 F (36.8 C)   Ht 5' 8.3" (1.735 m) Comment: pt had shoes on  Wt 189 lb (85.7 kg) Comment: pt had shoes on  SpO2 95%   BMI 28.49 kg/m    Subjective:    Patient ID: William Herring, male    DOB: 1970-12-17, 45 y.o.   MRN: [id number]  HPI: William Herring is a 45 y.o. male  Chief Complaint  Patient presents with  . Depression    4 week f/up   1. Chronic Pain  Patient is under mchronic opioid management for care for pain medication. Has been treated by [facility] in [place]. His is staus post Hemilaminectomy and discectomy about 1 year ago. He currently reports pain radiating from buttocks to the back of his knee. Describes it as a sharp burning pain. He is also taking Gabapentin which helps to "dull it." He is not sure of the dose. In addition, he takes Vicoden 7.5mg /300 TID. Has a temporary spinal cord stimulator which hs helped some. Nerve pain in leg is better.  Still thinking of getting a spinal cord stimulator   1.  Depression.  Last visit added Buproprion.  Pt states he is no better.   Depression screen [facility] 2/9 08/02/2016 05/31/2016 04/19/2016 01/03/2016  Decreased Interest 3 2 3 3   Down, Depressed, Hopeless 1 2 2 2   PHQ - 2 Score 4 4 5 5   Altered sleeping 0 0 1 0  Tired, decreased energy 3 1 3 2   Change in appetite 1 3 2 1   Feeling bad or failure about yourself  2 2 2 1   Trouble concentrating 0 0 0 -  Moving slowly or fidgety/restless 0 0 0 0  Suicidal thoughts 0 0 0 0  PHQ-9 Score 10 10 13 9      Relevant past medical, surgical, family and social history reviewed and updated as indicated. Interim medical history since our last visit reviewed. Allergies and medications reviewed and updated.  Review of Systems  Per HPI unless specifically indicated above     Objective:    BP 132/83 (BP Location: Left Arm, Patient Position: Sitting, Cuff Size:  Large)   Pulse 72   Temp 98.3 F (36.8 C)   Ht 5' 8.3" (1.735 m) Comment: pt had shoes on  Wt 189 lb (85.7 kg) Comment: pt had shoes on  SpO2 95%   BMI 28.49 kg/m   Wt Readings from Last 3 Encounters:  08/02/16 189 lb (85.7 kg)  05/31/16 193 lb 6.4 oz (87.7 kg)  04/19/16 195 lb 9.6 oz (88.7 kg)    Physical Exam  Constitutional: He is oriented to person, place, and time. He appears well-developed and well-nourished. No distress.  HENT:  Head: Normocephalic and atraumatic.  Eyes: Conjunctivae and lids are normal. Right eye exhibits no discharge. Left eye exhibits no discharge. No scleral icterus.  Neck: Normal range of motion. Neck supple. No JVD present. Carotid bruit is not present.  Cardiovascular: Normal rate, regular rhythm and normal heart sounds.   Pulmonary/Chest: Effort normal and breath sounds normal. No respiratory distress.  Abdominal: Normal appearance. There is no splenomegaly or hepatomegaly.  Musculoskeletal: Normal range of motion.  Neurological: He is alert and oriented to person, place, and time.  Skin: Skin is warm, dry and intact. No rash noted. No pallor.  Psychiatric: He has a normal mood and affect. His  behavior is normal. Judgment and thought content normal.  Nursing note and vitals reviewed.   Results for orders placed or performed in visit on 04/19/16  Comprehensive metabolic panel  Result Value Ref Range   Glucose 90 65 - 99 mg/dL   BUN 11 6 - 24 mg/dL   Creatinine, Ser 0.82 0.76 - 1.27 mg/dL   GFR calc non Af Amer 108 >59 mL/min/1.73   GFR calc Af Amer 124 >59 mL/min/1.73   BUN/Creatinine Ratio 13 9 - 20   Sodium 141 134 - 144 mmol/L   Potassium 4.2 3.5 - 5.2 mmol/L   Chloride 100 96 - 106 mmol/L   CO2 26 18 - 29 mmol/L   Calcium 9.7 8.7 - 10.2 mg/dL   Total Protein 7.2 6.0 - 8.5 g/dL   Albumin 4.6 3.5 - 5.5 g/dL   Globulin, Total 2.6 1.5 - 4.5 g/dL   Albumin/Globulin Ratio 1.8 1.2 - 2.2   Bilirubin Total 0.4 0.0 - 1.2 mg/dL   Alkaline  Phosphatase 63 39 - 117 IU/L   AST 28 0 - 40 IU/L   ALT 32 0 - 44 IU/L  CBC with Differential/Platelet  Result Value Ref Range   WBC 6.3 3.4 - 10.8 x10E3/uL   RBC 4.74 4.14 - 5.80 x10E6/uL   Hemoglobin 14.8 12.6 - 17.7 g/dL   Hematocrit 43.1 37.5 - 51.0 %   MCV 91 79 - 97 fL   MCH 31.2 26.6 - 33.0 pg   MCHC 34.3 31.5 - 35.7 g/dL   RDW 13.4 12.3 - 15.4 %   Platelets 208 150 - 379 x10E3/uL   Neutrophils 38 %   Lymphs 51 %   Monocytes 8 %   Eos 2 %   Basos 1 %   Neutrophils Absolute 2.4 1.4 - 7.0 x10E3/uL   Lymphocytes Absolute 3.2 (H) 0.7 - 3.1 x10E3/uL   Monocytes Absolute 0.5 0.1 - 0.9 x10E3/uL   EOS (ABSOLUTE) 0.1 0.0 - 0.4 x10E3/uL   Basophils Absolute 0.0 0.0 - 0.2 x10E3/uL   Immature Granulocytes 0 %   Immature Grans (Abs) 0.0 0.0 - 0.1 x10E3/uL  TSH  Result Value Ref Range   TSH 1.020 0.450 - 4.500 uIU/mL  VITAMIN D 25 Hydroxy (Vit-D Deficiency, Fractures)  Result Value Ref Range   Vit D, 25-Hydroxy 30.3 30.0 - 100.0 ng/mL  Lipid Panel w/o Chol/HDL Ratio  Result Value Ref Range   Cholesterol, Total 176 100 - 199 mg/dL   Triglycerides 315 (H) 0 - 149 mg/dL   HDL 28 (L) >39 mg/dL   VLDL Cholesterol Cal 63 (H) 5 - 40 mg/dL   LDL Calculated 85 0 - 99 mg/dL  Vitamin B12  Result Value Ref Range   Vitamin B-12 319 211 - 946 pg/mL  +++    Assessment & Plan:   Problem List Items Addressed This Visit      Unprioritized   Chronic back pain    Stable, continue present medications.        Depression    Not to goal.  Try Trintellix and will need to come off all his other anti-depressants      Relevant Medications   vortioxetine HBr (TRINTELLIX) 20 MG TABS    Other Visit Diagnoses   None.      Follow up plan: Return in about 4 weeks (around 08/30/2016).

## 2016-08-02 NOTE — Assessment & Plan Note (Signed)
Not to goal.  Try Trintellix and will need to come off all his other anti-depressants

## 2016-08-02 NOTE — Assessment & Plan Note (Signed)
Stable, continue present medications.   

## 2016-08-27 ENCOUNTER — Other Ambulatory Visit: Payer: Self-pay | Admitting: Unknown Physician Specialty

## 2016-08-28 ENCOUNTER — Other Ambulatory Visit: Payer: Self-pay | Admitting: Unknown Physician Specialty

## 2016-08-28 MED ORDER — CLONAZEPAM 0.5 MG PO TABS: 0.5000 mg | ORAL_TABLET | Freq: Every day | ORAL | 0 refills | Status: DC

## 2016-08-28 MED ORDER — HYDROCODONE-ACETAMINOPHEN 7.5-325 MG PO TABS: 1.0000 | ORAL_TABLET | Freq: Four times a day (QID) | ORAL | 0 refills | Status: DC | PRN

## 2016-08-29 ENCOUNTER — Ambulatory Visit (INDEPENDENT_AMBULATORY_CARE_PROVIDER_SITE_OTHER): Payer: 59 | Admitting: Unknown Physician Specialty

## 2016-08-29 ENCOUNTER — Encounter: Payer: Self-pay | Admitting: Unknown Physician Specialty

## 2016-08-29 DIAGNOSIS — F321 Major depressive disorder, single episode, moderate: Secondary | ICD-10-CM | POA: Diagnosis not present

## 2016-08-29 MED ORDER — BUPROPION HCL ER (XL) 300 MG PO TB24: 300.0000 mg | ORAL_TABLET | Freq: Every day | ORAL | 1 refills | Status: DC

## 2016-08-29 MED ORDER — CITALOPRAM HYDROBROMIDE 20 MG PO TABS: 30.0000 mg | ORAL_TABLET | Freq: Every day | ORAL | 1 refills | Status: DC

## 2016-08-29 NOTE — Assessment & Plan Note (Signed)
Increase Wellbutrin to 300 mg XR.  Increase Citalopram to 1 1/2 pills/day.  Recheck in 2 months

## 2016-08-29 NOTE — Progress Notes (Signed)
BP 131/86 (BP Location: Left Arm, Patient Position: Sitting, Cuff Size: Large)   Pulse 76   Temp 97.6 F (36.4 C)   Ht 5' 7.2" (1.707 m)   Wt 188 lb 6.4 oz (85.5 kg)   SpO2 97%   BMI 29.33 kg/m    Subjective:    Patient ID: William Herring, male    DOB: 1970/12/11, 45 y.o.   MRN: [id number]  HPI: William Herring is a 45 y.o. male  Chief Complaint  Patient presents with  . Depression   Here to f/u on the Trintellix.  However, he was not able to start this due to cost.  He was not able to tolerate 40 mg of Citalopram.  Adding on Buproprion "helped some" Depression screen [facility] 2/9 08/29/2016 08/02/2016 05/31/2016 04/19/2016 01/03/2016  Decreased Interest 2 3 2 3 3   Down, Depressed, Hopeless 2 1 2 2 2   PHQ - 2 Score 4 4 4 5 5   Altered sleeping 1 0 0 1 0  Tired, decreased energy 3 3 1 3 2   Change in appetite 2 1 3 2 1   Feeling bad or failure about yourself  1 2 2 2 1   Trouble concentrating 1 0 0 0 -  Moving slowly or fidgety/restless 0 0 0 0 0  Suicidal thoughts 0 0 0 0 0  PHQ-9 Score 12 10 10 13 9      Relevant past medical, surgical, family and social history reviewed and updated as indicated. Interim medical history since our last visit reviewed. Allergies and medications reviewed and updated.  Review of Systems  Per HPI unless specifically indicated above     Objective:    BP 131/86 (BP Location: Left Arm, Patient Position: Sitting, Cuff Size: Large)   Pulse 76   Temp 97.6 F (36.4 C)   Ht 5' 7.2" (1.707 m)   Wt 188 lb 6.4 oz (85.5 kg)   SpO2 97%   BMI 29.33 kg/m   Wt Readings from Last 3 Encounters:  08/29/16 188 lb 6.4 oz (85.5 kg)  08/02/16 189 lb (85.7 kg)  05/31/16 193 lb 6.4 oz (87.7 kg)    Physical Exam  Constitutional: He is oriented to person, place, and time. He appears well-developed and well-nourished. No distress.  HENT:  Head: Normocephalic and atraumatic.  Eyes: Conjunctivae and lids are normal. Right eye exhibits no discharge. Left eye  exhibits no discharge. No scleral icterus.  Neck: Normal range of motion. Neck supple. No JVD present. Carotid bruit is not present.  Cardiovascular: Normal rate, regular rhythm and normal heart sounds.   Pulmonary/Chest: Effort normal and breath sounds normal. No respiratory distress.  Abdominal: Normal appearance. There is no splenomegaly or hepatomegaly.  Musculoskeletal: Normal range of motion.  Neurological: He is alert and oriented to person, place, and time.  Skin: Skin is warm, dry and intact. No rash noted. No pallor.  Psychiatric: He has a normal mood and affect. His behavior is normal. Judgment and thought content normal.    Results for orders placed or performed in visit on 04/19/16  Comprehensive metabolic panel  Result Value Ref Range   Glucose 90 65 - 99 mg/dL   BUN 11 6 - 24 mg/dL   Creatinine, Ser 0.82 0.76 - 1.27 mg/dL   GFR calc non Af Amer 108 >59 mL/min/1.73   GFR calc Af Amer 124 >59 mL/min/1.73   BUN/Creatinine Ratio 13 9 - 20   Sodium 141 134 - 144 mmol/L   Potassium 4.2  3.5 - 5.2 mmol/L   Chloride 100 96 - 106 mmol/L   CO2 26 18 - 29 mmol/L   Calcium 9.7 8.7 - 10.2 mg/dL   Total Protein 7.2 6.0 - 8.5 g/dL   Albumin 4.6 3.5 - 5.5 g/dL   Globulin, Total 2.6 1.5 - 4.5 g/dL   Albumin/Globulin Ratio 1.8 1.2 - 2.2   Bilirubin Total 0.4 0.0 - 1.2 mg/dL   Alkaline Phosphatase 63 39 - 117 IU/L   AST 28 0 - 40 IU/L   ALT 32 0 - 44 IU/L  CBC with Differential/Platelet  Result Value Ref Range   WBC 6.3 3.4 - 10.8 x10E3/uL   RBC 4.74 4.14 - 5.80 x10E6/uL   Hemoglobin 14.8 12.6 - 17.7 g/dL   Hematocrit 43.1 37.5 - 51.0 %   MCV 91 79 - 97 fL   MCH 31.2 26.6 - 33.0 pg   MCHC 34.3 31.5 - 35.7 g/dL   RDW 13.4 12.3 - 15.4 %   Platelets 208 150 - 379 x10E3/uL   Neutrophils 38 %   Lymphs 51 %   Monocytes 8 %   Eos 2 %   Basos 1 %   Neutrophils Absolute 2.4 1.4 - 7.0 x10E3/uL   Lymphocytes Absolute 3.2 (H) 0.7 - 3.1 x10E3/uL   Monocytes Absolute 0.5 0.1 - 0.9  x10E3/uL   EOS (ABSOLUTE) 0.1 0.0 - 0.4 x10E3/uL   Basophils Absolute 0.0 0.0 - 0.2 x10E3/uL   Immature Granulocytes 0 %   Immature Grans (Abs) 0.0 0.0 - 0.1 x10E3/uL  TSH  Result Value Ref Range   TSH 1.020 0.450 - 4.500 uIU/mL  VITAMIN D 25 Hydroxy (Vit-D Deficiency, Fractures)  Result Value Ref Range   Vit D, 25-Hydroxy 30.3 30.0 - 100.0 ng/mL  Lipid Panel w/o Chol/HDL Ratio  Result Value Ref Range   Cholesterol, Total 176 100 - 199 mg/dL   Triglycerides 315 (H) 0 - 149 mg/dL   HDL 28 (L) >39 mg/dL   VLDL Cholesterol Cal 63 (H) 5 - 40 mg/dL   LDL Calculated 85 0 - 99 mg/dL  Vitamin B12  Result Value Ref Range   Vitamin B-12 319 211 - 946 pg/mL      Assessment & Plan:   Problem List Items Addressed This Visit      Unprioritized   Depression    Increase Wellbutrin to 300 mg XR.  Increase Citalopram to 1 1/2 pills/day.  Recheck in 2 months      Relevant Medications   citalopram (CELEXA) 20 MG tablet   buPROPion (WELLBUTRIN XL) 300 MG 24 hr tablet    Other Visit Diagnoses   None.      Follow up plan: Return in about 2 months (around 10/29/2016).

## 2016-08-29 NOTE — Patient Instructions (Signed)
ncrease Wellbutrin to 300 mg XR.  Increase Citalopram to 1 1/2 pills/day.  Recheck in 2 months

## 2016-09-20 ENCOUNTER — Other Ambulatory Visit: Payer: Self-pay | Admitting: Unknown Physician Specialty

## 2016-09-20 MED ORDER — HYDROCODONE-ACETAMINOPHEN 7.5-325 MG PO TABS: 1.0000 | ORAL_TABLET | Freq: Four times a day (QID) | ORAL | 0 refills | Status: DC | PRN

## 2016-09-20 MED ORDER — CLONAZEPAM 0.5 MG PO TABS: 0.5000 mg | ORAL_TABLET | Freq: Every day | ORAL | 0 refills | Status: DC

## 2016-10-20 ENCOUNTER — Other Ambulatory Visit: Payer: Self-pay | Admitting: Unknown Physician Specialty

## 2016-10-20 MED ORDER — CLONAZEPAM 0.5 MG PO TABS
0.5000 mg | ORAL_TABLET | Freq: Every day | ORAL | 0 refills | Status: DC
Start: 2016-10-20 — End: 2016-11-21

## 2016-10-20 MED ORDER — HYDROCODONE-ACETAMINOPHEN 7.5-325 MG PO TABS: 1.0000 | ORAL_TABLET | Freq: Four times a day (QID) | ORAL | 0 refills | Status: DC | PRN

## 2016-10-24 ENCOUNTER — Encounter: Payer: Self-pay | Admitting: Unknown Physician Specialty

## 2016-10-24 ENCOUNTER — Ambulatory Visit (INDEPENDENT_AMBULATORY_CARE_PROVIDER_SITE_OTHER): Payer: 59 | Admitting: Unknown Physician Specialty

## 2016-10-24 DIAGNOSIS — M5442 Lumbago with sciatica, left side: Secondary | ICD-10-CM

## 2016-10-24 DIAGNOSIS — M5441 Lumbago with sciatica, right side: Secondary | ICD-10-CM | POA: Diagnosis not present

## 2016-10-24 DIAGNOSIS — F321 Major depressive disorder, single episode, moderate: Secondary | ICD-10-CM

## 2016-10-24 DIAGNOSIS — G8929 Other chronic pain: Secondary | ICD-10-CM | POA: Diagnosis not present

## 2016-10-24 MED ORDER — ARIPIPRAZOLE 2 MG PO TABS: 2.0000 mg | ORAL_TABLET | Freq: Every day | ORAL | 1 refills | Status: DC

## 2016-10-24 NOTE — Patient Instructions (Addendum)
Methylcobalmin 500 mcgs/day  [employer] - goal is for DHA and EPA to equal 1,0000

## 2016-10-24 NOTE — Assessment & Plan Note (Signed)
Start Abilify 2 mg in the AM

## 2016-10-24 NOTE — Progress Notes (Signed)
BP 119/81 (BP Location: Left Arm, Patient Position: Sitting, Cuff Size: Large)   Pulse 70   Temp 97.2 F (36.2 C)   Wt 187 lb 12.8 oz (85.2 kg)   SpO2 97%   BMI 29.24 kg/m    Subjective:    Patient ID: William Herring, male    DOB: Jun 10, 1971, 45 y.o.   MRN: [id number]  HPI: William Herring is a 45 y.o. male  Chief Complaint  Patient presents with  . Depression    2 month f/up   1.Chronic Pain  Patient is under chronic opioid management for care for pain medication. Has been treated by [facility] in [place].His is staus post Hemilaminectomy and discectomy about 2 years ago.He currently reports pain radiating from buttocks to the back of his knee. Describes it as a sharp burning pain.He is also taking Gabapentin which helps to "dull it." In addition, he takes Vicoden 7.5mg /300 TID.Still thinking of getting a spinal cord stimulator  2.  Depression He is not taking the Wellbutrin as that wasn't working.  He is taking Citalopram BID.   Depression screen [facility] 2/9 10/24/2016 08/29/2016 08/02/2016 05/31/2016 04/19/2016  Decreased Interest 3 2 3 2 3   Down, Depressed, Hopeless 3 2 1 2 2   PHQ - 2 Score 6 4 4 4 5   Altered sleeping 0 1 0 0 1  Tired, decreased energy 3 3 3 1 3   Change in appetite 2 2 1 3 2   Feeling bad or failure about yourself  1 1 2 2 2   Trouble concentrating 0 1 0 0 0  Moving slowly or fidgety/restless 1 0 0 0 0  Suicidal thoughts 0 0 0 0 0  PHQ-9 Score 13 12 10 10 13      Relevant past medical, surgical, family and social history reviewed and updated as indicated. Interim medical history since our last visit reviewed. Allergies and medications reviewed and updated.  Review of Systems  Per HPI unless specifically indicated above     Objective:    BP 119/81 (BP Location: Left Arm, Patient Position: Sitting, Cuff Size: Large)   Pulse 70   Temp 97.2 F (36.2 C)   Wt 187 lb 12.8 oz (85.2 kg)   SpO2 97%   BMI 29.24 kg/m   Wt Readings  from Last 3 Encounters:  10/24/16 187 lb 12.8 oz (85.2 kg)  08/29/16 188 lb 6.4 oz (85.5 kg)  08/02/16 189 lb (85.7 kg)    Physical Exam  Constitutional: He is oriented to person, place, and time. He appears well-developed and well-nourished. No distress.  HENT:  Head: Normocephalic and atraumatic.  Eyes: Conjunctivae and lids are normal. Right eye exhibits no discharge. Left eye exhibits no discharge. No scleral icterus.  Neck: Normal range of motion. Neck supple. No JVD present. Carotid bruit is not present.  Cardiovascular: Normal rate, regular rhythm and normal heart sounds.   Pulmonary/Chest: Effort normal and breath sounds normal. No respiratory distress.  Abdominal: Normal appearance. There is no splenomegaly or hepatomegaly.  Musculoskeletal: Normal range of motion.  Neurological: He is alert and oriented to person, place, and time.  Skin: Skin is warm, dry and intact. No rash noted. No pallor.  Psychiatric: He has a normal mood and affect. His behavior is normal. Judgment and thought content normal.    Results for orders placed or performed in visit on 04/19/16  Comprehensive metabolic panel  Result Value Ref Range   Glucose 90 65 - 99 mg/dL  BUN 11 6 - 24 mg/dL   Creatinine, Ser 0.82 0.76 - 1.27 mg/dL   GFR calc non Af Amer 108 >59 mL/min/1.73   GFR calc Af Amer 124 >59 mL/min/1.73   BUN/Creatinine Ratio 13 9 - 20   Sodium 141 134 - 144 mmol/L   Potassium 4.2 3.5 - 5.2 mmol/L   Chloride 100 96 - 106 mmol/L   CO2 26 18 - 29 mmol/L   Calcium 9.7 8.7 - 10.2 mg/dL   Total Protein 7.2 6.0 - 8.5 g/dL   Albumin 4.6 3.5 - 5.5 g/dL   Globulin, Total 2.6 1.5 - 4.5 g/dL   Albumin/Globulin Ratio 1.8 1.2 - 2.2   Bilirubin Total 0.4 0.0 - 1.2 mg/dL   Alkaline Phosphatase 63 39 - 117 IU/L   AST 28 0 - 40 IU/L   ALT 32 0 - 44 IU/L  CBC with Differential/Platelet  Result Value Ref Range   WBC 6.3 3.4 - 10.8 x10E3/uL   RBC 4.74 4.14 - 5.80 x10E6/uL   Hemoglobin 14.8 12.6 - 17.7  g/dL   Hematocrit 43.1 37.5 - 51.0 %   MCV 91 79 - 97 fL   MCH 31.2 26.6 - 33.0 pg   MCHC 34.3 31.5 - 35.7 g/dL   RDW 13.4 12.3 - 15.4 %   Platelets 208 150 - 379 x10E3/uL   Neutrophils 38 %   Lymphs 51 %   Monocytes 8 %   Eos 2 %   Basos 1 %   Neutrophils Absolute 2.4 1.4 - 7.0 x10E3/uL   Lymphocytes Absolute 3.2 (H) 0.7 - 3.1 x10E3/uL   Monocytes Absolute 0.5 0.1 - 0.9 x10E3/uL   EOS (ABSOLUTE) 0.1 0.0 - 0.4 x10E3/uL   Basophils Absolute 0.0 0.0 - 0.2 x10E3/uL   Immature Granulocytes 0 %   Immature Grans (Abs) 0.0 0.0 - 0.1 x10E3/uL  TSH  Result Value Ref Range   TSH 1.020 0.450 - 4.500 uIU/mL  VITAMIN D 25 Hydroxy (Vit-D Deficiency, Fractures)  Result Value Ref Range   Vit D, 25-Hydroxy 30.3 30.0 - 100.0 ng/mL  Lipid Panel w/o Chol/HDL Ratio  Result Value Ref Range   Cholesterol, Total 176 100 - 199 mg/dL   Triglycerides 315 (H) 0 - 149 mg/dL   HDL 28 (L) >39 mg/dL   VLDL Cholesterol Cal 63 (H) 5 - 40 mg/dL   LDL Calculated 85 0 - 99 mg/dL  Vitamin B12  Result Value Ref Range   Vitamin B-12 319 211 - 946 pg/mL      Assessment & Plan:   Problem List Items Addressed This Visit      Unprioritized   Chronic back pain    Stable.  Continue present treatment.  Add Vit B12 supplementation for low normal B12      Depression    Start Abilify 2 mg in the AM          Follow up plan: Return in about 4 weeks (around 11/21/2016).

## 2016-10-24 NOTE — Assessment & Plan Note (Signed)
Stable.  Continue present treatment.  Add Vit B12 supplementation for low normal B12

## 2016-11-05 ENCOUNTER — Other Ambulatory Visit: Payer: Self-pay | Admitting: Unknown Physician Specialty

## 2016-11-21 ENCOUNTER — Encounter: Payer: Self-pay | Admitting: Unknown Physician Specialty

## 2016-11-21 ENCOUNTER — Ambulatory Visit (INDEPENDENT_AMBULATORY_CARE_PROVIDER_SITE_OTHER): Payer: 59 | Admitting: Unknown Physician Specialty

## 2016-11-21 DIAGNOSIS — F321 Major depressive disorder, single episode, moderate: Secondary | ICD-10-CM | POA: Diagnosis not present

## 2016-11-21 MED ORDER — HYDROCODONE-ACETAMINOPHEN 7.5-325 MG PO TABS: 1.0000 | ORAL_TABLET | Freq: Four times a day (QID) | ORAL | 0 refills | Status: DC | PRN

## 2016-11-21 MED ORDER — ARIPIPRAZOLE 5 MG PO TABS: 5.0000 mg | ORAL_TABLET | Freq: Every day | ORAL | 2 refills | Status: DC

## 2016-11-21 MED ORDER — CLONAZEPAM 0.5 MG PO TABS: 0.5000 mg | ORAL_TABLET | Freq: Every day | ORAL | 0 refills | Status: DC

## 2016-11-21 NOTE — Progress Notes (Signed)
BP 118/76 (BP Location: Left Arm, Patient Position: Sitting, Cuff Size: Large)   Pulse 72   Temp 98.1 F (36.7 C)   Wt 190 lb (86.2 kg)   SpO2 96%   BMI 29.58 kg/m    Subjective:    Patient ID: William Herring, male    DOB: 12/27/1970, 46 y.o.   MRN: [id number]  HPI: William Herring is a 46 y.o. male  Chief Complaint  Patient presents with  . Depression    1 month f/up   Depression Pt is doing better with the add on of Abilify.  He states he had one day of side-effects but got much better and seemed to help Depression screen [facility] 2/9 11/21/2016 10/24/2016 08/29/2016 08/02/2016 05/31/2016  Decreased Interest 1 3 2 3 2   Down, Depressed, Hopeless 1 3 2 1 2   PHQ - 2 Score 2 6 4 4 4   Altered sleeping 0 0 1 0 0  Tired, decreased energy 2 3 3 3 1   Change in appetite 2 2 2 1 3   Feeling bad or failure about yourself  1 1 1 2 2   Trouble concentrating 1 0 1 0 0  Moving slowly or fidgety/restless 1 1 0 0 0  Suicidal thoughts 0 0 0 0 0  PHQ-9 Score 9 13 12 10 10      Relevant past medical, surgical, family and social history reviewed and updated as indicated. Interim medical history since our last visit reviewed. Allergies and medications reviewed and updated.  Review of Systems  Per HPI unless specifically indicated above     Objective:    BP 118/76 (BP Location: Left Arm, Patient Position: Sitting, Cuff Size: Large)   Pulse 72   Temp 98.1 F (36.7 C)   Wt 190 lb (86.2 kg)   SpO2 96%   BMI 29.58 kg/m   Wt Readings from Last 3 Encounters:  11/21/16 190 lb (86.2 kg)  10/24/16 187 lb 12.8 oz (85.2 kg)  08/29/16 188 lb 6.4 oz (85.5 kg)    Physical Exam  Constitutional: He is oriented to person, place, and time. He appears well-developed and well-nourished. No distress.  HENT:  Head: Normocephalic and atraumatic.  Eyes: Conjunctivae and lids are normal. Right eye exhibits no discharge. Left eye exhibits no discharge. No scleral icterus.  Cardiovascular: Normal rate.    Pulmonary/Chest: Effort normal.  Abdominal: Normal appearance. There is no splenomegaly or hepatomegaly.  Musculoskeletal: Normal range of motion.  Neurological: He is alert and oriented to person, place, and time.  Skin: Skin is intact. No rash noted. No pallor.  Psychiatric: He has a normal mood and affect. His behavior is normal. Judgment and thought content normal.    Results for orders placed or performed in visit on 04/19/16  Comprehensive metabolic panel  Result Value Ref Range   Glucose 90 65 - 99 mg/dL   BUN 11 6 - 24 mg/dL   Creatinine, Ser 0.82 0.76 - 1.27 mg/dL   GFR calc non Af Amer 108 >59 mL/min/1.73   GFR calc Af Amer 124 >59 mL/min/1.73   BUN/Creatinine Ratio 13 9 - 20   Sodium 141 134 - 144 mmol/L   Potassium 4.2 3.5 - 5.2 mmol/L   Chloride 100 96 - 106 mmol/L   CO2 26 18 - 29 mmol/L   Calcium 9.7 8.7 - 10.2 mg/dL   Total Protein 7.2 6.0 - 8.5 g/dL   Albumin 4.6 3.5 - 5.5 g/dL   Globulin, Total 2.6 1.5 -  4.5 g/dL   Albumin/Globulin Ratio 1.8 1.2 - 2.2   Bilirubin Total 0.4 0.0 - 1.2 mg/dL   Alkaline Phosphatase 63 39 - 117 IU/L   AST 28 0 - 40 IU/L   ALT 32 0 - 44 IU/L  CBC with Differential/Platelet  Result Value Ref Range   WBC 6.3 3.4 - 10.8 x10E3/uL   RBC 4.74 4.14 - 5.80 x10E6/uL   Hemoglobin 14.8 12.6 - 17.7 g/dL   Hematocrit 43.1 37.5 - 51.0 %   MCV 91 79 - 97 fL   MCH 31.2 26.6 - 33.0 pg   MCHC 34.3 31.5 - 35.7 g/dL   RDW 13.4 12.3 - 15.4 %   Platelets 208 150 - 379 x10E3/uL   Neutrophils 38 %   Lymphs 51 %   Monocytes 8 %   Eos 2 %   Basos 1 %   Neutrophils Absolute 2.4 1.4 - 7.0 x10E3/uL   Lymphocytes Absolute 3.2 (H) 0.7 - 3.1 x10E3/uL   Monocytes Absolute 0.5 0.1 - 0.9 x10E3/uL   EOS (ABSOLUTE) 0.1 0.0 - 0.4 x10E3/uL   Basophils Absolute 0.0 0.0 - 0.2 x10E3/uL   Immature Granulocytes 0 %   Immature Grans (Abs) 0.0 0.0 - 0.1 x10E3/uL  TSH  Result Value Ref Range   TSH 1.020 0.450 - 4.500 uIU/mL  VITAMIN D 25 Hydroxy (Vit-D  Deficiency, Fractures)  Result Value Ref Range   Vit D, 25-Hydroxy 30.3 30.0 - 100.0 ng/mL  Lipid Panel w/o Chol/HDL Ratio  Result Value Ref Range   Cholesterol, Total 176 100 - 199 mg/dL   Triglycerides 315 (H) 0 - 149 mg/dL   HDL 28 (L) >39 mg/dL   VLDL Cholesterol Cal 63 (H) 5 - 40 mg/dL   LDL Calculated 85 0 - 99 mg/dL  Vitamin B12  Result Value Ref Range   Vitamin B-12 319 211 - 946 pg/mL      Assessment & Plan:   Problem List Items Addressed This Visit      Unprioritized   Depression    Improved with the addition of Abilify.  Increase to 5 mg.            Follow up plan: Return in about 2 months (around 01/19/2017).

## 2016-11-21 NOTE — Assessment & Plan Note (Signed)
Improved with the addition of Abilify.  Increase to 5 mg.

## 2016-11-21 NOTE — Addendum Note (Signed)
Addended by: Kathrine Haddock on: 11/21/2016 03:50 PM   Modules accepted: Orders

## 2016-12-19 ENCOUNTER — Other Ambulatory Visit: Payer: Self-pay | Admitting: Unknown Physician Specialty

## 2016-12-19 MED ORDER — HYDROCODONE-ACETAMINOPHEN 7.5-325 MG PO TABS: 1.0000 | ORAL_TABLET | Freq: Four times a day (QID) | ORAL | 0 refills | Status: DC | PRN

## 2016-12-19 MED ORDER — CLONAZEPAM 0.5 MG PO TABS: 0.5000 mg | ORAL_TABLET | Freq: Every day | ORAL | 0 refills | Status: DC

## 2017-01-02 ENCOUNTER — Other Ambulatory Visit: Payer: Self-pay | Admitting: Unknown Physician Specialty

## 2017-01-02 MED ORDER — HYDROCODONE-ACETAMINOPHEN 7.5-325 MG PO TABS: 1.0000 | ORAL_TABLET | Freq: Four times a day (QID) | ORAL | 0 refills | Status: DC | PRN

## 2017-01-02 MED ORDER — CLONAZEPAM 0.5 MG PO TABS: 0.5000 mg | ORAL_TABLET | Freq: Every day | ORAL | 0 refills | Status: DC

## 2017-01-16 ENCOUNTER — Other Ambulatory Visit: Payer: Self-pay | Admitting: Unknown Physician Specialty

## 2017-01-16 MED ORDER — HYDROCODONE-ACETAMINOPHEN 7.5-325 MG PO TABS: 1.0000 | ORAL_TABLET | Freq: Four times a day (QID) | ORAL | 0 refills | Status: DC | PRN

## 2017-01-16 MED ORDER — CLONAZEPAM 0.5 MG PO TABS: 0.5000 mg | ORAL_TABLET | Freq: Every day | ORAL | 0 refills | Status: DC

## 2017-01-22 ENCOUNTER — Ambulatory Visit: Payer: 59 | Admitting: Unknown Physician Specialty

## 2017-02-02 ENCOUNTER — Ambulatory Visit (INDEPENDENT_AMBULATORY_CARE_PROVIDER_SITE_OTHER): Payer: 59 | Admitting: Unknown Physician Specialty

## 2017-02-02 ENCOUNTER — Encounter: Payer: Self-pay | Admitting: Unknown Physician Specialty

## 2017-02-02 DIAGNOSIS — M5442 Lumbago with sciatica, left side: Secondary | ICD-10-CM | POA: Diagnosis not present

## 2017-02-02 DIAGNOSIS — F321 Major depressive disorder, single episode, moderate: Secondary | ICD-10-CM | POA: Diagnosis not present

## 2017-02-02 DIAGNOSIS — F1190 Opioid use, unspecified, uncomplicated: Secondary | ICD-10-CM | POA: Diagnosis not present

## 2017-02-02 DIAGNOSIS — G8929 Other chronic pain: Secondary | ICD-10-CM | POA: Diagnosis not present

## 2017-02-02 DIAGNOSIS — M5441 Lumbago with sciatica, right side: Secondary | ICD-10-CM | POA: Diagnosis not present

## 2017-02-02 MED ORDER — CITALOPRAM HYDROBROMIDE 40 MG PO TABS: 40.0000 mg | ORAL_TABLET | Freq: Every day | ORAL | 3 refills | Status: DC

## 2017-02-02 MED ORDER — HYDROCODONE-ACETAMINOPHEN 7.5-325 MG PO TABS: 1.0000 | ORAL_TABLET | Freq: Four times a day (QID) | ORAL | 0 refills | Status: DC | PRN

## 2017-02-02 MED ORDER — CLONAZEPAM 0.5 MG PO TABS: 0.5000 mg | ORAL_TABLET | Freq: Every day | ORAL | 0 refills | Status: DC

## 2017-02-02 NOTE — Progress Notes (Signed)
BP 112/76 (BP Location: Left Arm, Patient Position: Sitting, Cuff Size: Normal)   Pulse 73   Temp 98.2 F (36.8 C)   Ht 5' 8.1" (1.73 m) Comment: pt had shoes on  Wt 188 lb 11.2 oz (85.6 kg) Comment: pt had shoes on  SpO2 95%   BMI 28.61 kg/m    Subjective:    Patient ID: William Herring, male    DOB: 1971-10-02, 46 y.o.   MRN: [id number]  HPI: William Herring is a 46 y.o. male  Chief Complaint  Patient presents with  . Depression    2 month f/up   Depression Initially Ability really worked.  He was on 2.5 mg and increased to 5 mg.  Had a significant side-effect of snoring and stopped it but also states it stopped working.  Restarted Citalopram at 40 mg/day.  He has tried Cymbalta and Effexor in the past without good resultsf Depression screen [facility] 2/9 02/02/2017 11/21/2016 10/24/2016 08/29/2016 08/02/2016  Decreased Interest 3 1 3 2 3   Down, Depressed, Hopeless 2 1 3 2 1   PHQ - 2 Score 5 2 6 4 4   Altered sleeping 1 0 0 1 0  Tired, decreased energy 2 2 3 3 3   Change in appetite 2 2 2 2 1   Feeling bad or failure about yourself  1 1 1 1 2   Trouble concentrating 1 1 0 1 0  Moving slowly or fidgety/restless 1 1 1  0 0  Suicidal thoughts 0 0 0 0 0  PHQ-9 Score 13 9 13 12 10    Chronic pain Patient is under chronic opioid management for care for pain medication. Has was started on this therapy by [facility] in [place].His is staus post Hemilaminectomy and discectomy about 2.5 years ago.He currently reports pain radiating from buttocks to the back of his knee. Describes it as a sharp burning pain.He is also taking Gabapentin which helps to "dull it." In addition, he takes Vicoden 7.5mg /300 TID.Still thinking of getting a spinal cord stimulator and reports no change today of his current pain.  He requests an early prescription today due to traveling.  Relevant past medical, surgical, family and social history reviewed and updated as indicated. Interim medical history  since our last visit reviewed. Allergies and medications reviewed and updated.  Review of Systems  Per HPI unless specifically indicated above     Objective:    BP 112/76 (BP Location: Left Arm, Patient Position: Sitting, Cuff Size: Normal)   Pulse 73   Temp 98.2 F (36.8 C)   Ht 5' 8.1" (1.73 m) Comment: pt had shoes on  Wt 188 lb 11.2 oz (85.6 kg) Comment: pt had shoes on  SpO2 95%   BMI 28.61 kg/m   Wt Readings from Last 3 Encounters:  02/02/17 188 lb 11.2 oz (85.6 kg)  11/21/16 190 lb (86.2 kg)  10/24/16 187 lb 12.8 oz (85.2 kg)    Physical Exam  Constitutional: He is oriented to person, place, and time. He appears well-developed and well-nourished. No distress.  HENT:  Head: Normocephalic and atraumatic.  Eyes: Conjunctivae and lids are normal. Right eye exhibits no discharge. Left eye exhibits no discharge. No scleral icterus.  Cardiovascular: Normal rate.   Pulmonary/Chest: Effort normal.  Abdominal: Normal appearance. There is no splenomegaly or hepatomegaly.  Musculoskeletal: Normal range of motion.  Neurological: He is alert and oriented to person, place, and time.  Skin: Skin is intact. No rash noted. No pallor.  Psychiatric: He  has a normal mood and affect. His behavior is normal. Judgment and thought content normal.    Results for orders placed or performed in visit on 04/19/16  Comprehensive metabolic panel  Result Value Ref Range   Glucose 90 65 - 99 mg/dL   BUN 11 6 - 24 mg/dL   Creatinine, Ser 0.82 0.76 - 1.27 mg/dL   GFR calc non Af Amer 108 >59 mL/min/1.73   GFR calc Af Amer 124 >59 mL/min/1.73   BUN/Creatinine Ratio 13 9 - 20   Sodium 141 134 - 144 mmol/L   Potassium 4.2 3.5 - 5.2 mmol/L   Chloride 100 96 - 106 mmol/L   CO2 26 18 - 29 mmol/L   Calcium 9.7 8.7 - 10.2 mg/dL   Total Protein 7.2 6.0 - 8.5 g/dL   Albumin 4.6 3.5 - 5.5 g/dL   Globulin, Total 2.6 1.5 - 4.5 g/dL   Albumin/Globulin Ratio 1.8 1.2 - 2.2   Bilirubin Total 0.4 0.0 - 1.2  mg/dL   Alkaline Phosphatase 63 39 - 117 IU/L   AST 28 0 - 40 IU/L   ALT 32 0 - 44 IU/L  CBC with Differential/Platelet  Result Value Ref Range   WBC 6.3 3.4 - 10.8 x10E3/uL   RBC 4.74 4.14 - 5.80 x10E6/uL   Hemoglobin 14.8 12.6 - 17.7 g/dL   Hematocrit 43.1 37.5 - 51.0 %   MCV 91 79 - 97 fL   MCH 31.2 26.6 - 33.0 pg   MCHC 34.3 31.5 - 35.7 g/dL   RDW 13.4 12.3 - 15.4 %   Platelets 208 150 - 379 x10E3/uL   Neutrophils 38 %   Lymphs 51 %   Monocytes 8 %   Eos 2 %   Basos 1 %   Neutrophils Absolute 2.4 1.4 - 7.0 x10E3/uL   Lymphocytes Absolute 3.2 (H) 0.7 - 3.1 x10E3/uL   Monocytes Absolute 0.5 0.1 - 0.9 x10E3/uL   EOS (ABSOLUTE) 0.1 0.0 - 0.4 x10E3/uL   Basophils Absolute 0.0 0.0 - 0.2 x10E3/uL   Immature Granulocytes 0 %   Immature Grans (Abs) 0.0 0.0 - 0.1 x10E3/uL  TSH  Result Value Ref Range   TSH 1.020 0.450 - 4.500 uIU/mL  VITAMIN D 25 Hydroxy (Vit-D Deficiency, Fractures)  Result Value Ref Range   Vit D, 25-Hydroxy 30.3 30.0 - 100.0 ng/mL  Lipid Panel w/o Chol/HDL Ratio  Result Value Ref Range   Cholesterol, Total 176 100 - 199 mg/dL   Triglycerides 315 (H) 0 - 149 mg/dL   HDL 28 (L) >39 mg/dL   VLDL Cholesterol Cal 63 (H) 5 - 40 mg/dL   LDL Calculated 85 0 - 99 mg/dL  Vitamin B12  Result Value Ref Range   Vitamin B-12 319 211 - 946 pg/mL      Assessment & Plan:   Problem List Items Addressed This Visit      Unprioritized   Chronic back pain    Stable but on chronic opioid management.  Will rx an earlier prescription due to travels.  Last rx was 3/13.  Will write to not fill until April 7th before he leaves.        Relevant Medications   HYDROcodone-acetaminophen (NORCO) 7.5-325 MG tablet   Chronic, continuous use of opioids    Rx for early refill due to travels for work.  Needs updated narcotic contract and drug screen.  Will get next visit      Depression    Major depression which is  worsening.  Failed multiple medication.  Refer to psychiatry for  further evaluation.      Relevant Medications   citalopram (CELEXA) 40 MG tablet   Other Relevant Orders   Ambulatory referral to Psychiatry       Follow up plan: Return in about 3 months (around 05/05/2017).

## 2017-02-02 NOTE — Assessment & Plan Note (Signed)
Stable but on chronic opioid management.  Will rx an earlier prescription due to travels.  Last rx was 3/13.  Will write to not fill until April 7th before he leaves.

## 2017-02-02 NOTE — Assessment & Plan Note (Addendum)
Rx for early refill due to travels for work.  Needs updated narcotic contract and drug screen.  Will get next visit

## 2017-02-02 NOTE — Assessment & Plan Note (Addendum)
Major depression which is worsening.  Failed multiple medication.  Refer to psychiatry for further evaluation.

## 2017-02-25 ENCOUNTER — Other Ambulatory Visit: Payer: Self-pay | Admitting: Unknown Physician Specialty

## 2017-03-06 ENCOUNTER — Other Ambulatory Visit: Payer: Self-pay | Admitting: Unknown Physician Specialty

## 2017-03-06 DIAGNOSIS — Z5181 Encounter for therapeutic drug level monitoring: Secondary | ICD-10-CM

## 2017-03-06 MED ORDER — CLONAZEPAM 0.5 MG PO TABS: 0.5000 mg | ORAL_TABLET | Freq: Every day | ORAL | 0 refills | Status: DC

## 2017-03-06 MED ORDER — HYDROCODONE-ACETAMINOPHEN 7.5-325 MG PO TABS: 1.0000 | ORAL_TABLET | Freq: Four times a day (QID) | ORAL | 0 refills | Status: DC | PRN

## 2017-03-13 ENCOUNTER — Other Ambulatory Visit: Payer: 59

## 2017-03-13 DIAGNOSIS — Z5181 Encounter for therapeutic drug level monitoring: Secondary | ICD-10-CM

## 2017-03-16 LAB — URINE DRUGS OF ABUSE SCREEN W ALC, ROUTINE (REF LAB)
Amphetamines, Urine: NEGATIVE ng/mL
BENZODIAZEPINE QUANT UR: NEGATIVE ng/mL
Barbiturate Quant, Ur: NEGATIVE ng/mL
COCAINE (METAB.): NEGATIVE ng/mL
Cannabinoid Quant, Ur: NEGATIVE ng/mL
Ethanol U, Quan: NEGATIVE %
METHADONE SCREEN, URINE: NEGATIVE ng/mL
PCP QUANT UR: NEGATIVE ng/mL
Propoxyphene: NEGATIVE ng/mL

## 2017-03-16 LAB — OPIATES CONFIRMATION, URINE: OPIATES: NEGATIVE

## 2017-04-04 ENCOUNTER — Other Ambulatory Visit: Payer: Self-pay | Admitting: Unknown Physician Specialty

## 2017-04-04 MED ORDER — CLONAZEPAM 0.5 MG PO TABS: 0.5000 mg | ORAL_TABLET | Freq: Every day | ORAL | 0 refills | Status: DC

## 2017-04-04 MED ORDER — HYDROCODONE-ACETAMINOPHEN 7.5-325 MG PO TABS: 1.0000 | ORAL_TABLET | Freq: Four times a day (QID) | ORAL | 0 refills | Status: DC | PRN

## 2017-04-12 ENCOUNTER — Ambulatory Visit (INDEPENDENT_AMBULATORY_CARE_PROVIDER_SITE_OTHER): Payer: 59 | Admitting: Family Medicine

## 2017-04-12 ENCOUNTER — Encounter: Payer: Self-pay | Admitting: Family Medicine

## 2017-04-12 VITALS — BP 127/83 | HR 80 | Temp 98.3°F | Wt 186.0 lb

## 2017-04-12 DIAGNOSIS — R5383 Other fatigue: Secondary | ICD-10-CM | POA: Diagnosis not present

## 2017-04-12 NOTE — Assessment & Plan Note (Signed)
Will check RMSF and lyme labs given pt sxs. Discussed prophylactic doxycycline, pt prefers to wait until results come back. Tylenol and ibuprofen prn for sxs

## 2017-04-12 NOTE — Progress Notes (Signed)
   BP 127/83   Pulse 80   Temp 98.3 F (36.8 C)   Wt 186 lb (84.4 kg)   SpO2 97%   BMI 28.20 kg/m    Subjective:    Patient ID: William Herring, male    DOB: [birth year], 46 y.o.   MRN: [id number]  HPI: William Herring is a 46 y.o. male  Chief Complaint  Patient presents with  . Tick Bite    right groin area, noticed it about 3 weeks ago. Now it's red and painful. No rash. He states he just feels bad.   Has been having some ongoing nausea and diarrhea since the tick bite. Notes that he's had some chills, headaches, fatigue, and subjective fevers since. Some of these sxs are chronic per pt d/t chronic fatigue and pain syndrome, but states he feels worse lately than usual. Bite was in right groin area. Healing well.   Relevant past medical, surgical, family and social history reviewed and updated as indicated. Interim medical history since our last visit reviewed. Allergies and medications reviewed and updated.  Review of Systems  Constitutional: Positive for chills and fatigue.  HENT: Negative.   Eyes: Negative.   Respiratory: Negative.   Cardiovascular: Negative.   Gastrointestinal: Positive for diarrhea and nausea.  Genitourinary: Negative.   Musculoskeletal: Positive for myalgias.  Neurological: Positive for headaches.  Psychiatric/Behavioral: Negative.     Per HPI unless specifically indicated above     Objective:    BP 127/83   Pulse 80   Temp 98.3 F (36.8 C)   Wt 186 lb (84.4 kg)   SpO2 97%   BMI 28.20 kg/m   Wt Readings from Last 3 Encounters:  04/12/17 186 lb (84.4 kg)  02/02/17 188 lb 11.2 oz (85.6 kg)  11/21/16 190 lb (86.2 kg)    Physical Exam  Constitutional: He is oriented to person, place, and time. He appears well-developed and well-nourished. No distress.  HENT:  Head: Atraumatic.  Eyes: Conjunctivae are normal. Pupils are equal, round, and reactive to light.  Neck: Normal range of motion. Neck supple.  Cardiovascular: Normal rate and  normal heart sounds.   Pulmonary/Chest: Effort normal and breath sounds normal. No respiratory distress.  Musculoskeletal: Normal range of motion.  Lymphadenopathy:    He has no cervical adenopathy.  Neurological: He is alert and oriented to person, place, and time. No cranial nerve deficit.  Skin: Skin is warm and dry. No rash noted.  Well healing tick bite site, right groin  Psychiatric: He has a normal mood and affect. His behavior is normal.  Nursing note and vitals reviewed.     Assessment & Plan:   Problem List Items Addressed This Visit      Other   Fatigue - Primary    Will check RMSF and lyme labs given pt sxs. Discussed prophylactic doxycycline, pt prefers to wait until results come back. Tylenol and ibuprofen prn for sxs      Relevant Orders   Rocky mtn spotted fvr abs pnl(IgG+IgM)   Lyme Ab/Western Blot Reflex       Follow up plan: Return if symptoms worsen or fail to improve.

## 2017-04-17 LAB — LYME AB/WESTERN BLOT REFLEX: Lyme IgG/IgM Ab: <0.91 {ISR} (ref 0.00–0.90)

## 2017-04-17 LAB — ROCKY MTN SPOTTED FVR ABS PNL(IGG+IGM)
RMSF IGG: NEGATIVE
RMSF IgM: 0.25 index (ref 0.00–0.89)

## 2017-05-02 ENCOUNTER — Other Ambulatory Visit: Payer: Self-pay | Admitting: Unknown Physician Specialty

## 2017-05-02 MED ORDER — HYDROCODONE-ACETAMINOPHEN 7.5-325 MG PO TABS: 1.0000 | ORAL_TABLET | Freq: Four times a day (QID) | ORAL | 0 refills | Status: DC | PRN

## 2017-05-02 MED ORDER — ARIPIPRAZOLE 5 MG PO TABS: 5.0000 mg | ORAL_TABLET | Freq: Every day | ORAL | 2 refills | Status: DC

## 2017-05-08 ENCOUNTER — Encounter: Payer: Self-pay | Admitting: Unknown Physician Specialty

## 2017-05-08 ENCOUNTER — Ambulatory Visit (INDEPENDENT_AMBULATORY_CARE_PROVIDER_SITE_OTHER): Payer: 59 | Admitting: Unknown Physician Specialty

## 2017-05-08 DIAGNOSIS — F321 Major depressive disorder, single episode, moderate: Secondary | ICD-10-CM | POA: Diagnosis not present

## 2017-05-08 DIAGNOSIS — M5442 Lumbago with sciatica, left side: Secondary | ICD-10-CM

## 2017-05-08 DIAGNOSIS — M5441 Lumbago with sciatica, right side: Secondary | ICD-10-CM

## 2017-05-08 DIAGNOSIS — G894 Chronic pain syndrome: Secondary | ICD-10-CM | POA: Diagnosis not present

## 2017-05-08 DIAGNOSIS — G8929 Other chronic pain: Secondary | ICD-10-CM

## 2017-05-08 MED ORDER — CLONAZEPAM 0.5 MG PO TABS: 0.5000 mg | ORAL_TABLET | Freq: Every day | ORAL | 0 refills | Status: DC

## 2017-05-08 NOTE — Assessment & Plan Note (Signed)
See below

## 2017-05-08 NOTE — Addendum Note (Signed)
Addended by: Kathrine Haddock on: 05/08/2017 04:40 PM   Modules accepted: Orders

## 2017-05-08 NOTE — Assessment & Plan Note (Signed)
Continue present medications.  Discussed thinking about weaning down medications.

## 2017-05-08 NOTE — Progress Notes (Signed)
BP 126/85   Pulse 68   Temp 98.2 F (36.8 C)   Wt 189 lb 9.6 oz (86 kg)   SpO2 98%   BMI 28.74 kg/m    Subjective:    Patient ID: William Herring, male    DOB: 1971/03/08, 46 y.o.   MRN: [id number]  HPI: [name] is a 46 y.o. male  Chief Complaint  Patient presents with  . Depression    3 month f/up   Depression This seems to be stable.   States feeling a little better than previous.    Depression screen [facility] 2/9 05/08/2017 02/02/2017 11/21/2016 10/24/2016 08/29/2016  Decreased Interest 3 3 1 3 2   Down, Depressed, Hopeless 2 2 1 3 2   PHQ - 2 Score 5 5 2 6 4   Altered sleeping 1 1 0 0 1  Tired, decreased energy 3 2 2 3 3   Change in appetite 1 2 2 2 2   Feeling bad or failure about yourself  1 1 1 1 1   Trouble concentrating 2 1 1  0 1  Moving slowly or fidgety/restless 0 1 1 1  0  Suicidal thoughts 0 0 0 0 0  PHQ-9 Score 13 13 9 13 12    Chronic pain Patient is under chronic opioid management for care for pain medication. Has was started on this therapy by [facility] in [place] for the past 8 years.His is staus post Hemilaminectomy and discectomy about 8 yearsago.He currently reports pain radiating from buttocks to the back of his knee. Describes it as a sharp burning pain.He is also taking Gabapentin which helps to "dull it." In addition, he takes Vicoden 7.5mg /300 TID.  Still thinking of getting a spinal cord stimulator and continued contemplation.    Relevant past medical, surgical, family and social history reviewed and updated as indicated. Interim medical history since our last visit reviewed. Allergies and medications reviewed and updated.  Review of Systems  Per HPI unless specifically indicated above     Objective:    BP 126/85   Pulse 68   Temp 98.2 F (36.8 C)   Wt 189 lb 9.6 oz (86 kg)   SpO2 98%   BMI 28.74 kg/m   Wt Readings from Last 3 Encounters:  05/08/17 189 lb 9.6 oz (86 kg)  04/12/17 186 lb (84.4 kg)  02/02/17 188 lb  11.2 oz (85.6 kg)    Physical Exam  Constitutional: He is oriented to person, place, and time. He appears well-developed and well-nourished. No distress.  HENT:  Head: Normocephalic and atraumatic.  Eyes: Conjunctivae and lids are normal. Right eye exhibits no discharge. Left eye exhibits no discharge. No scleral icterus.  Neck: Normal range of motion. Neck supple. No JVD present. Carotid bruit is not present.  Cardiovascular: Normal rate, regular rhythm and normal heart sounds.   Pulmonary/Chest: Effort normal and breath sounds normal. No respiratory distress.  Abdominal: Normal appearance. There is no splenomegaly or hepatomegaly.  Musculoskeletal: Normal range of motion.  Neurological: He is alert and oriented to person, place, and time.  Skin: Skin is warm, dry and intact. No rash noted. No pallor.  Psychiatric: He has a normal mood and affect. His behavior is normal. Judgment and thought content normal.      Assessment & Plan:   Problem List Items Addressed This Visit      Unprioritized   Chronic back pain    See below      Chronic pain syndrome    Continue  present medications.  Discussed thinking about weaning down medications.        Depression    This is stable.  Will continue present medication          Follow up plan: Return in about 3 months (around 08/08/2017) for physical.

## 2017-05-08 NOTE — Assessment & Plan Note (Signed)
This is stable.  Will continue present medication

## 2017-05-24 ENCOUNTER — Other Ambulatory Visit: Payer: Self-pay | Admitting: Unknown Physician Specialty

## 2017-06-04 ENCOUNTER — Other Ambulatory Visit: Payer: Self-pay | Admitting: Unknown Physician Specialty

## 2017-06-04 MED ORDER — CLONAZEPAM 0.5 MG PO TABS
0.5000 mg | ORAL_TABLET | Freq: Every day | ORAL | 0 refills | Status: DC
Start: 2017-06-04 — End: 2017-06-13

## 2017-06-04 MED ORDER — HYDROCODONE-ACETAMINOPHEN 7.5-325 MG PO TABS: 1.0000 | ORAL_TABLET | Freq: Four times a day (QID) | ORAL | 0 refills | Status: DC | PRN

## 2017-06-13 ENCOUNTER — Other Ambulatory Visit: Payer: Self-pay | Admitting: Unknown Physician Specialty

## 2017-06-13 MED ORDER — CLONAZEPAM 0.5 MG PO TABS: 0.5000 mg | ORAL_TABLET | Freq: Every day | ORAL | 0 refills | Status: DC

## 2017-06-13 MED ORDER — HYDROCODONE-ACETAMINOPHEN 7.5-325 MG PO TABS: 1.0000 | ORAL_TABLET | Freq: Four times a day (QID) | ORAL | 0 refills | Status: DC | PRN

## 2017-06-29 ENCOUNTER — Other Ambulatory Visit: Payer: Self-pay | Admitting: Unknown Physician Specialty

## 2017-06-29 MED ORDER — CLONAZEPAM 0.5 MG PO TABS: 0.5000 mg | ORAL_TABLET | Freq: Every day | ORAL | 0 refills | Status: DC

## 2017-06-29 MED ORDER — HYDROCODONE-ACETAMINOPHEN 7.5-325 MG PO TABS: 1.0000 | ORAL_TABLET | Freq: Four times a day (QID) | ORAL | 0 refills | Status: DC | PRN

## 2017-08-10 ENCOUNTER — Other Ambulatory Visit: Payer: Self-pay | Admitting: Unknown Physician Specialty

## 2017-08-10 MED ORDER — CLONAZEPAM 0.5 MG PO TABS: 0.5000 mg | ORAL_TABLET | Freq: Every day | ORAL | 0 refills | Status: DC

## 2017-08-10 MED ORDER — HYDROCODONE-ACETAMINOPHEN 7.5-325 MG PO TABS: 1.0000 | ORAL_TABLET | Freq: Four times a day (QID) | ORAL | 0 refills | Status: DC | PRN

## 2017-08-19 ENCOUNTER — Other Ambulatory Visit: Payer: Self-pay | Admitting: Unknown Physician Specialty

## 2017-08-28 ENCOUNTER — Encounter: Payer: Self-pay | Admitting: Unknown Physician Specialty

## 2017-08-28 ENCOUNTER — Ambulatory Visit (INDEPENDENT_AMBULATORY_CARE_PROVIDER_SITE_OTHER): Payer: 59 | Admitting: Unknown Physician Specialty

## 2017-08-28 VITALS — BP 118/79 | HR 71 | Temp 98.4°F | Ht 68.3 in | Wt 183.1 lb

## 2017-08-28 DIAGNOSIS — K219 Gastro-esophageal reflux disease without esophagitis: Secondary | ICD-10-CM | POA: Diagnosis not present

## 2017-08-28 DIAGNOSIS — F322 Major depressive disorder, single episode, severe without psychotic features: Secondary | ICD-10-CM | POA: Diagnosis not present

## 2017-08-28 DIAGNOSIS — Z0000 Encounter for general adult medical examination without abnormal findings: Secondary | ICD-10-CM

## 2017-08-28 DIAGNOSIS — F1190 Opioid use, unspecified, uncomplicated: Secondary | ICD-10-CM

## 2017-08-28 DIAGNOSIS — Z0001 Encounter for general adult medical examination with abnormal findings: Secondary | ICD-10-CM

## 2017-08-28 MED ORDER — OMEPRAZOLE 20 MG PO CPDR: 20.0000 mg | DELAYED_RELEASE_CAPSULE | Freq: Every day | ORAL | 3 refills | Status: DC

## 2017-08-28 NOTE — Assessment & Plan Note (Signed)
No change in current dosage of Hydrocodone and Clonazepam

## 2017-08-28 NOTE — Assessment & Plan Note (Addendum)
Refractory to treatment.  Will refer again to psychiatry.  Referred to [facility] due to preference

## 2017-08-28 NOTE — Addendum Note (Signed)
Addended by: Kathrine Haddock on: 08/28/2017 04:11 PM   Modules accepted: Level of Service

## 2017-08-28 NOTE — Progress Notes (Addendum)
BP 118/79   Pulse 71   Temp 98.4 F (36.9 C)   Ht 5' 8.3" (1.735 m)   Wt 183 lb 1.6 oz (83.1 kg)   SpO2 97%   BMI 27.60 kg/m    Subjective:    Patient ID: William Herring, male    DOB: Apr 26, 1971, 46 y.o.   MRN: [id number]  HPI: William Herring is a 46 y.o. male  Chief Complaint  Patient presents with  . Annual Exam  . Depression   Depression Pt said no change.  Referred to psychiatry but never made it there for unknown reasons.  He has failed multiple medications.  Citalopram has worked the best but that is failing at this time.  Tried adding Wellbutrin and Abilify without any improvement in symptoms.   Depression screen [facility] 2/9 08/28/2017 05/08/2017 02/02/2017 11/21/2016 10/24/2016  Decreased Interest 3 3 3 1 3   Down, Depressed, Hopeless 3 2 2 1 3   PHQ - 2 Score 6 5 5 2 6   Altered sleeping 1 1 1  0 0  Tired, decreased energy 3 3 2 2 3   Change in appetite 2 1 2 2 2   Feeling bad or failure about yourself  2 1 1 1 1   Trouble concentrating 1 2 1 1  0  Moving slowly or fidgety/restless 0 0 1 1 1   Suicidal thoughts 0 0 0 0 0  PHQ-9 Score 15 13 13 9 13   Difficult doing work/chores Extremely dIfficult - - - -  Some recent data might be hidden   Chronic pain Taking Hydrocodone and Clonazepam.  No real change in pain.  No change in doses  GERD Burning and pain in stomach.  Pt states "I tried everything" but has not helped.  Some OTC's help on occasion.  Social History   Social History  . Marital status: Married    Spouse name: N/A  . Number of children: N/A  . Years of education: N/A   Occupational History  . Not on file.   Social History Main Topics  . Smoking status: Former Research scientist (life sciences)  . Smokeless tobacco: Former Systems developer    Types: Chew  . Alcohol use 0.0 oz/week     Comment: on occasion  . Drug use: No  . Sexual activity: Yes   Other Topics Concern  . Not on file   Social History Narrative  . No narrative on file   Family History  Problem Relation Age of  Onset  . Hypertension Mother   . Mental illness Mother   . Stroke Paternal Grandmother   . Asthma Father    History reviewed. No pertinent past medical history.  Past Surgical History:  Procedure Laterality Date  . HEMILAMINOTOMY LUMBAR SPINE     pt states he had this 5 to 6 years ago   . HERNIA REPAIR  inguinal  . LUMBAR DISC SURGERY     pt states he had surgery 5 or 6 years ago     Relevant past medical, surgical, family and social history reviewed and updated as indicated. Interim medical history since our last visit reviewed. Allergies and medications reviewed and updated.  Review of Systems  Per HPI unless specifically indicated above     Objective:    BP 118/79   Pulse 71   Temp 98.4 F (36.9 C)   Ht 5' 8.3" (1.735 m)   Wt 183 lb 1.6 oz (83.1 kg)   SpO2 97%   BMI 27.60 kg/m   Wt Readings from  Last 3 Encounters:  08/28/17 183 lb 1.6 oz (83.1 kg)  05/08/17 189 lb 9.6 oz (86 kg)  04/12/17 186 lb (84.4 kg)    Physical Exam  Constitutional: He is oriented to person, place, and time. He appears well-developed and well-nourished.  HENT:  Head: Normocephalic.  Right Ear: Tympanic membrane, external ear and ear canal normal.  Left Ear: Tympanic membrane, external ear and ear canal normal.  Mouth/Throat: Uvula is midline, oropharynx is clear and moist and mucous membranes are normal.  Eyes: Pupils are equal, round, and reactive to light.  Cardiovascular: Normal rate, regular rhythm and normal heart sounds.  Exam reveals no gallop and no friction rub.   No murmur heard. Pulmonary/Chest: Effort normal and breath sounds normal. No respiratory distress.  Abdominal: Soft. Bowel sounds are normal. He exhibits no distension. There is no tenderness.  Musculoskeletal: Normal range of motion.  Neurological: He is alert and oriented to person, place, and time. He has normal reflexes.  Skin: Skin is warm and dry.  Psychiatric: He has a normal mood and affect. His behavior is  normal. Judgment and thought content normal.    Results for orders placed or performed in visit on 04/12/17  Rocky mtn spotted fvr abs pnl(IgG+IgM)  Result Value Ref Range   RMSF IgG Negative Negative   RMSF IgM 0.25 0.00 - 0.89 index  Lyme Ab/Western Blot Reflex  Result Value Ref Range   Lyme IgG/IgM Ab <0.91 0.00 - 0.90 ISR   LYME DISEASE AB, QUANT, IGM <0.80 0.00 - 0.79 index      Assessment & Plan:   Problem List Items Addressed This Visit      Unprioritized   Chronic, continuous use of opioids    No change in current dosage of Hydrocodone and Clonazepam      Depression, major, single episode, severe (HCC)    Refractory to treatment.  Will refer again to psychiatry.  Referred to [facility] due to preference      Relevant Orders   Ambulatory referral to Psychiatry   GERD (gastroesophageal reflux disease)    New problem.  Start Omeprazole 20 mg daily.        Relevant Medications   omeprazole (PRILOSEC) 20 MG capsule    Other Visit Diagnoses    Annual physical exam    -  Primary   Relevant Orders   CBC with Differential/Platelet   Comprehensive metabolic panel   Lipid Panel w/o Chol/HDL Ratio   TSH       Follow up plan: Return in about 3 months (around 11/28/2017).

## 2017-08-28 NOTE — Assessment & Plan Note (Signed)
New problem.  Start Omeprazole 20 mg daily.

## 2017-08-29 LAB — CBC WITH DIFFERENTIAL/PLATELET
Basophils Absolute: 0 x10E3/uL (ref 0.0–0.2)
Basos: 1 %
EOS (ABSOLUTE): 0.1 x10E3/uL (ref 0.0–0.4)
Eos: 2 %
Hematocrit: 41.3 % (ref 37.5–51.0)
Hemoglobin: 14.3 g/dL (ref 13.0–17.7)
Immature Grans (Abs): 0 x10E3/uL (ref 0.0–0.1)
Immature Granulocytes: 0 %
Lymphocytes Absolute: 3.8 x10E3/uL — ABNORMAL HIGH (ref 0.7–3.1)
Lymphs: 58 %
MCH: 30.8 pg (ref 26.6–33.0)
MCHC: 34.6 g/dL (ref 31.5–35.7)
MCV: 89 fL (ref 79–97)
Monocytes Absolute: 0.5 x10E3/uL (ref 0.1–0.9)
Monocytes: 7 %
Neutrophils Absolute: 2.1 x10E3/uL (ref 1.4–7.0)
Neutrophils: 32 %
Platelets: 230 x10E3/uL (ref 150–379)
RBC: 4.65 x10E6/uL (ref 4.14–5.80)
RDW: 13.6 % (ref 12.3–15.4)
WBC: 6.6 x10E3/uL (ref 3.4–10.8)

## 2017-08-29 LAB — COMPREHENSIVE METABOLIC PANEL
A/G RATIO: 2.2 (ref 1.2–2.2)
ALBUMIN: 4.7 g/dL (ref 3.5–5.5)
ALK PHOS: 80 IU/L (ref 39–117)
ALT: 26 IU/L (ref 0–44)
AST: 23 IU/L (ref 0–40)
BILIRUBIN TOTAL: 0.4 mg/dL (ref 0.0–1.2)
BUN / CREAT RATIO: 16 (ref 9–20)
BUN: 12 mg/dL (ref 6–24)
CHLORIDE: 104 mmol/L (ref 96–106)
CO2: 24 mmol/L (ref 20–29)
Calcium: 9.8 mg/dL (ref 8.7–10.2)
Creatinine, Ser: 0.74 mg/dL — ABNORMAL LOW (ref 0.76–1.27)
GFR calc non Af Amer: 111 mL/min/{1.73_m2} (ref >59)
GFR, EST AFRICAN AMERICAN: 128 mL/min/{1.73_m2} (ref >59)
GLOBULIN, TOTAL: 2.1 g/dL (ref 1.5–4.5)
Glucose: 93 mg/dL (ref 65–99)
Potassium: 4.2 mmol/L (ref 3.5–5.2)
SODIUM: 143 mmol/L (ref 134–144)
Total Protein: 6.8 g/dL (ref 6.0–8.5)

## 2017-08-29 LAB — LIPID PANEL W/O CHOL/HDL RATIO
CHOLESTEROL TOTAL: 158 mg/dL (ref 100–199)
HDL: 25 mg/dL — ABNORMAL LOW (ref >39)
LDL Calculated: 62 mg/dL (ref 0–99)
Triglycerides: 355 mg/dL — ABNORMAL HIGH (ref 0–149)
VLDL Cholesterol Cal: 71 mg/dL — ABNORMAL HIGH (ref 5–40)

## 2017-08-29 LAB — TSH: TSH: 1.36 u[IU]/mL (ref 0.450–4.500)

## 2017-09-24 ENCOUNTER — Other Ambulatory Visit: Payer: Self-pay | Admitting: Unknown Physician Specialty

## 2017-09-24 MED ORDER — HYDROCODONE-ACETAMINOPHEN 7.5-325 MG PO TABS: 1.0000 | ORAL_TABLET | Freq: Four times a day (QID) | ORAL | 0 refills | Status: DC | PRN

## 2017-09-24 MED ORDER — CLONAZEPAM 0.5 MG PO TABS: 0.5000 mg | ORAL_TABLET | Freq: Every day | ORAL | 0 refills | Status: DC

## 2017-10-17 ENCOUNTER — Other Ambulatory Visit: Payer: Self-pay | Admitting: Unknown Physician Specialty

## 2017-10-17 MED ORDER — HYDROCODONE-ACETAMINOPHEN 7.5-325 MG PO TABS: 1.0000 | ORAL_TABLET | Freq: Four times a day (QID) | ORAL | 0 refills | Status: DC | PRN

## 2017-10-17 MED ORDER — CLONAZEPAM 0.5 MG PO TABS: 0.5000 mg | ORAL_TABLET | Freq: Every day | ORAL | 0 refills | Status: DC

## 2017-11-01 ENCOUNTER — Encounter: Payer: Self-pay | Admitting: Unknown Physician Specialty

## 2017-11-13 ENCOUNTER — Other Ambulatory Visit: Payer: Self-pay | Admitting: Unknown Physician Specialty

## 2017-11-13 MED ORDER — CLONAZEPAM 0.5 MG PO TABS: 0.5000 mg | ORAL_TABLET | Freq: Every day | ORAL | 0 refills | Status: DC

## 2017-11-13 MED ORDER — HYDROCODONE-ACETAMINOPHEN 7.5-325 MG PO TABS: 1.0000 | ORAL_TABLET | Freq: Four times a day (QID) | ORAL | 0 refills | Status: DC | PRN

## 2017-11-27 ENCOUNTER — Ambulatory Visit: Payer: 59 | Admitting: Unknown Physician Specialty

## 2017-12-04 ENCOUNTER — Encounter: Payer: Self-pay | Admitting: Unknown Physician Specialty

## 2017-12-04 ENCOUNTER — Ambulatory Visit: Payer: 59 | Admitting: Unknown Physician Specialty

## 2017-12-04 VITALS — BP 126/85 | HR 73 | Temp 98.4°F | Wt 183.4 lb

## 2017-12-04 DIAGNOSIS — R1084 Generalized abdominal pain: Secondary | ICD-10-CM

## 2017-12-04 DIAGNOSIS — F321 Major depressive disorder, single episode, moderate: Secondary | ICD-10-CM

## 2017-12-04 DIAGNOSIS — F1190 Opioid use, unspecified, uncomplicated: Secondary | ICD-10-CM

## 2017-12-04 LAB — CBC WITH DIFFERENTIAL/PLATELET
HEMATOCRIT: 42.7 % (ref 37.5–51.0)
Hemoglobin: 15.6 g/dL (ref 13.0–17.7)
Lymphocytes Absolute: 2.6 10*3/uL (ref 0.7–3.1)
Lymphs: 39 %
MCH: 31.5 pg (ref 26.6–33.0)
MCHC: 36.5 g/dL — ABNORMAL HIGH (ref 31.5–35.7)
MCV: 86 fL (ref 79–97)
MID (ABSOLUTE): 0.5 10*3/uL (ref 0.1–1.6)
MID: 8 %
Neutrophils Absolute: 3.5 10*3/uL (ref 1.4–7.0)
Neutrophils: 53 %
Platelets: 206 10*3/uL (ref 150–379)
RBC: 4.95 x10E6/uL (ref 4.14–5.80)
RDW: 12.8 % (ref 12.3–15.4)
WBC: 6.6 10*3/uL (ref 3.4–10.8)

## 2017-12-04 LAB — UA/M W/RFLX CULTURE, ROUTINE
BILIRUBIN UA: NEGATIVE
GLUCOSE, UA: NEGATIVE
KETONES UA: NEGATIVE
Leukocytes, UA: NEGATIVE
NITRITE UA: NEGATIVE
Protein, UA: NEGATIVE
RBC UA: NEGATIVE
Specific Gravity, UA: 1.02 (ref 1.005–1.030)
UUROB: 0.2 mg/dL (ref 0.2–1.0)
pH, UA: 6 (ref 5.0–7.5)

## 2017-12-04 MED ORDER — PROMETHAZINE HCL 25 MG PO TABS: 25.0000 mg | ORAL_TABLET | Freq: Four times a day (QID) | ORAL | 0 refills | Status: DC | PRN

## 2017-12-04 NOTE — Assessment & Plan Note (Signed)
Pain is stable.  Will continue present medications.

## 2017-12-04 NOTE — Progress Notes (Signed)
BP 126/85   Pulse 73   Temp 98.4 F (36.9 C) (Oral)   Wt 183 lb 6.4 oz (83.2 kg)   SpO2 96%   BMI 27.64 kg/m    Subjective:    Patient ID: William Herring, male    DOB: 07-Apr-1971, 47 y.o.   MRN: [id number]  HPI: William Herring is a 47 y.o. male  Chief Complaint  Patient presents with  . Depression   Depression States his depression is "about the same" He is seeing psychiatry.  States the psychiatrist changed up one of his medications.  He is not sure exactly what he is taking    Depression screen [facility] 2/9 12/04/2017 08/28/2017 05/08/2017 02/02/2017 11/21/2016  Decreased Interest 3 3 3 3 1   Down, Depressed, Hopeless 2 3 2 2 1   PHQ - 2 Score 5 6 5 5 2   Altered sleeping 1 1 1 1  0  Tired, decreased energy 3 3 3 2 2   Change in appetite 1 2 1 2 2   Feeling bad or failure about yourself  2 2 1 1 1   Trouble concentrating 0 1 2 1 1   Moving slowly or fidgety/restless 1 0 0 1 1  Suicidal thoughts 0 0 0 0 0  PHQ-9 Score 13 15 13 13 9   Difficult doing work/chores - Extremely dIfficult - - -  Some recent data might be hidden    Chronic pain Patient is under chronic opioid management for care for pain medication. Has was started on this therapyby [facility] in [place] for the past 9 years.His is staus post Hemilaminectomy and discectomy about 9yearsago.He currently reports pain from his low back, to his  buttocks to the back of his knee.Dysyrd it as a sharp burning pain.He is also taking Gabapentin which helps to "dull it." In addition, he takes Vicoden 7.5mg /300 TID.  Abdominal Pain  This is a new problem. Episode onset: since last nigh. The problem occurs constantly. The problem has been waxing and waning. The pain is located in the generalized abdominal region. The quality of the pain is aching. The abdominal pain does not radiate. Associated symptoms include diarrhea and nausea. Pertinent negatives include no anorexia, arthralgias, belching, constipation, dysuria,  headaches, hematochezia, hematuria, vomiting or weight loss. Nothing aggravates the pain. The pain is relieved by nothing. He has tried nothing for the symptoms.  No change in pain meds.  Relevant past medical, surgical, family and social history reviewed and updated as indicated. Interim medical history since our last visit reviewed. Allergies and medications reviewed and updated.  Review of Systems  Constitutional: Negative for weight loss.  Gastrointestinal: Positive for abdominal pain, diarrhea and nausea. Negative for anorexia, constipation, hematochezia and vomiting.  Genitourinary: Negative for dysuria and hematuria.  Musculoskeletal: Negative for arthralgias.  Neurological: Negative for headaches.    Per HPI unless specifically indicated above     Objective:    BP 126/85   Pulse 73   Temp 98.4 F (36.9 C) (Oral)   Wt 183 lb 6.4 oz (83.2 kg)   SpO2 96%   BMI 27.64 kg/m   Wt Readings from Last 3 Encounters:  12/04/17 183 lb 6.4 oz (83.2 kg)  08/28/17 183 lb 1.6 oz (83.1 kg)  05/08/17 189 lb 9.6 oz (86 kg)    Physical Exam  Constitutional: He is oriented to person, place, and time. He appears well-developed and well-nourished. No distress.  HENT:  Head: Normocephalic and atraumatic.  Right Ear: Tympanic membrane, external ear  and ear canal normal.  Left Ear: Tympanic membrane, external ear and ear canal normal.  Mouth/Throat: Uvula is midline, oropharynx is clear and moist and mucous membranes are normal.  Eyes: Conjunctivae and lids are normal. Pupils are equal, round, and reactive to light. Right eye exhibits no discharge. Left eye exhibits no discharge. No scleral icterus.  Neck: Normal range of motion. Neck supple. No JVD present. Carotid bruit is not present.  Cardiovascular: Normal rate, regular rhythm and normal heart sounds. Exam reveals no gallop and no friction rub.  No murmur heard. Pulmonary/Chest: Effort normal and breath sounds normal. No respiratory  distress.  Abdominal: Soft. Normal appearance and bowel sounds are normal. He exhibits no distension. There is no splenomegaly or hepatomegaly. There is no tenderness.  Musculoskeletal: Normal range of motion.  Neurological: He is alert and oriented to person, place, and time. He has normal reflexes.  Skin: Skin is warm, dry and intact. No rash noted. No pallor.  Psychiatric: He has a normal mood and affect. His behavior is normal. Judgment and thought content normal.   Results for orders placed or performed in visit on 08/28/17  CBC with Differential/Platelet  Result Value Ref Range   WBC 6.6 3.4 - 10.8 x10E3/uL   RBC 4.65 4.14 - 5.80 x10E6/uL   Hemoglobin 14.3 13.0 - 17.7 g/dL   Hematocrit 41.3 37.5 - 51.0 %   MCV 89 79 - 97 fL   MCH 30.8 26.6 - 33.0 pg   MCHC 34.6 31.5 - 35.7 g/dL   RDW 13.6 12.3 - 15.4 %   Platelets 230 150 - 379 x10E3/uL   Neutrophils 32 Not Estab. %   Lymphs 58 Not Estab. %   Monocytes 7 Not Estab. %   Eos 2 Not Estab. %   Basos 1 Not Estab. %   Neutrophils Absolute 2.1 1.4 - 7.0 x10E3/uL   Lymphocytes Absolute 3.8 (H) 0.7 - 3.1 x10E3/uL   Monocytes Absolute 0.5 0.1 - 0.9 x10E3/uL   EOS (ABSOLUTE) 0.1 0.0 - 0.4 x10E3/uL   Basophils Absolute 0.0 0.0 - 0.2 x10E3/uL   Immature Granulocytes 0 Not Estab. %   Immature Grans (Abs) 0.0 0.0 - 0.1 x10E3/uL  Comprehensive metabolic panel  Result Value Ref Range   Glucose 93 65 - 99 mg/dL   BUN 12 6 - 24 mg/dL   Creatinine, Ser 0.74 (L) 0.76 - 1.27 mg/dL   GFR calc non Af Amer 111 >59 mL/min/1.73   GFR calc Af Amer 128 >59 mL/min/1.73   BUN/Creatinine Ratio 16 9 - 20   Sodium 143 134 - 144 mmol/L   Potassium 4.2 3.5 - 5.2 mmol/L   Chloride 104 96 - 106 mmol/L   CO2 24 20 - 29 mmol/L   Calcium 9.8 8.7 - 10.2 mg/dL   Total Protein 6.8 6.0 - 8.5 g/dL   Albumin 4.7 3.5 - 5.5 g/dL   Globulin, Total 2.1 1.5 - 4.5 g/dL   Albumin/Globulin Ratio 2.2 1.2 - 2.2   Bilirubin Total 0.4 0.0 - 1.2 mg/dL   Alkaline  Phosphatase 80 39 - 117 IU/L   AST 23 0 - 40 IU/L   ALT 26 0 - 44 IU/L  Lipid Panel w/o Chol/HDL Ratio  Result Value Ref Range   Cholesterol, Total 158 100 - 199 mg/dL   Triglycerides 355 (H) 0 - 149 mg/dL   HDL 25 (L) >39 mg/dL   VLDL Cholesterol Cal 71 (H) 5 - 40 mg/dL   LDL Calculated 62 0 -  99 mg/dL  TSH  Result Value Ref Range   TSH 1.360 0.450 - 4.500 uIU/mL      Assessment & Plan:   Problem List Items Addressed This Visit      Unprioritized   Chronic, continuous use of opioids    Pain is stable.  Will continue present medications.        Depression    Seeing psychiatry.  Planning on beginning counseling      Relevant Medications   DULoxetine (CYMBALTA) 20 MG capsule   citalopram (CELEXA) 10 MG tablet    Other Visit Diagnoses    Generalized abdominal pain    -  Primary   WBC and urine are normal.  Will rx Phenergan.  Focus on fluids with slow advancement of diet.     Relevant Orders   CBC With Differential/Platelet   UA/M w/rflx Culture, Routine       Follow up plan: Return in about 3 months (around 03/04/2018) for physical-.

## 2017-12-04 NOTE — Assessment & Plan Note (Signed)
Seeing psychiatry.  Planning on beginning counseling

## 2017-12-11 ENCOUNTER — Other Ambulatory Visit: Payer: Self-pay | Admitting: Unknown Physician Specialty

## 2017-12-11 MED ORDER — HYDROCODONE-ACETAMINOPHEN 7.5-325 MG PO TABS: 1.0000 | ORAL_TABLET | Freq: Four times a day (QID) | ORAL | 0 refills | Status: DC | PRN

## 2017-12-11 MED ORDER — CLONAZEPAM 0.5 MG PO TABS: 0.5000 mg | ORAL_TABLET | Freq: Every day | ORAL | 0 refills | Status: DC

## 2017-12-11 NOTE — Progress Notes (Signed)
Refill meds

## 2018-01-07 ENCOUNTER — Other Ambulatory Visit: Payer: Self-pay | Admitting: Unknown Physician Specialty

## 2018-01-07 MED ORDER — CLONAZEPAM 0.5 MG PO TABS: 0.5000 mg | ORAL_TABLET | Freq: Every day | ORAL | 0 refills | Status: DC

## 2018-01-07 MED ORDER — HYDROCODONE-ACETAMINOPHEN 7.5-325 MG PO TABS: 1.0000 | ORAL_TABLET | Freq: Four times a day (QID) | ORAL | 0 refills | Status: DC | PRN

## 2018-01-25 ENCOUNTER — Encounter: Payer: Self-pay | Admitting: Unknown Physician Specialty

## 2018-01-25 ENCOUNTER — Ambulatory Visit (INDEPENDENT_AMBULATORY_CARE_PROVIDER_SITE_OTHER): Payer: 59 | Admitting: Unknown Physician Specialty

## 2018-01-25 DIAGNOSIS — F1190 Opioid use, unspecified, uncomplicated: Secondary | ICD-10-CM | POA: Diagnosis not present

## 2018-01-25 DIAGNOSIS — G894 Chronic pain syndrome: Secondary | ICD-10-CM

## 2018-01-25 NOTE — Assessment & Plan Note (Signed)
No change.  Continue present medication

## 2018-01-25 NOTE — Progress Notes (Signed)
BP (!) 138/93   Pulse (!) 123   Temp 98.4 F (36.9 C) (Oral)   Ht 5' 8.3" (1.735 m)   Wt 186 lb 11.2 oz (84.7 kg)   SpO2 96%   BMI 28.14 kg/m    Subjective:    Patient ID: William Herring, male    DOB: Mar 14, 1971, 47 y.o.   MRN: [id number]  HPI: William Herring is a 47 y.o. male  Chief Complaint  Patient presents with  . Depression   Depression Seeing psychiatry.  Has not started counseling but planning on calling.   Depression screen [facility] 2/9 01/25/2018 12/04/2017 08/28/2017 05/08/2017 02/02/2017  Decreased Interest 3 3 3 3 3   Down, Depressed, Hopeless 2 2 3 2 2   PHQ - 2 Score 5 5 6 5 5   Altered sleeping 1 1 1 1 1   Tired, decreased energy 3 3 3 3 2   Change in appetite 1 1 2 1 2   Feeling bad or failure about yourself  2 2 2 1 1   Trouble concentrating 0 0 1 2 1   Moving slowly or fidgety/restless 1 1 0 0 1  Suicidal thoughts 0 0 0 0 0  PHQ-9 Score 13 13 15 13 13   Difficult doing work/chores - - Extremely dIfficult - -  Some recent data might be hidden   Chronic pain Patient is under chronic opioid management for care for pain medication. [place] [facility] [place] [place].His is staus post Hemilaminectomy and discectomy.He describes pain from his low back, to his  buttocks to the back of his knee.Describes it as a sharp burning pain.He is also taking Gabapentin which helps to "dull it." In addition, he takes Vicoden 7.5mg /300 TID.  Relevant past medical, surgical, family and social history reviewed and updated as indicated. Interim medical history since our last visit reviewed. Allergies and medications reviewed and updated.  Review of Systems  Per HPI unless specifically indicated above     Objective:    BP (!) 138/93   Pulse (!) 123   Temp 98.4 F (36.9 C) (Oral)   Ht 5' 8.3" (1.735 m)   Wt 186 lb 11.2 oz (84.7 kg)   SpO2 96%   BMI 28.14 kg/m   Wt Readings from Last 3 Encounters:  01/25/18 186 lb  11.2 oz (84.7 kg)  12/04/17 183 lb 6.4 oz (83.2 kg)  08/28/17 183 lb 1.6 oz (83.1 kg)    Physical Exam  Constitutional: He is oriented to person, place, and time. He appears well-developed and well-nourished. No distress.  HENT:  Head: Normocephalic and atraumatic.  Eyes: Conjunctivae and lids are normal. Right eye exhibits no discharge. Left eye exhibits no discharge. No scleral icterus.  Neck: Normal range of motion. Neck supple. No JVD present. Carotid bruit is not present.  Cardiovascular: Normal rate, regular rhythm and normal heart sounds.  Pulmonary/Chest: Effort normal and breath sounds normal. No respiratory distress.  Abdominal: Normal appearance. There is no splenomegaly or hepatomegaly.  Musculoskeletal: Normal range of motion.  Neurological: He is alert and oriented to person, place, and time.  Skin: Skin is warm, dry and intact. No rash noted. No pallor.  Psychiatric: He has a normal mood and affect. His behavior is normal. Judgment and thought content normal.    Results for orders placed or performed in visit on 12/04/17  CBC With Differential/Platelet  Result Value Ref Range   WBC 6.6 3.4 - 10.8 x10E3/uL  RBC 4.95 4.14 - 5.80 x10E6/uL   Hemoglobin 15.6 13.0 - 17.7 g/dL   Hematocrit 42.7 37.5 - 51.0 %   MCV 86 79 - 97 fL   MCH 31.5 26.6 - 33.0 pg   MCHC 36.5 (H) 31.5 - 35.7 g/dL   RDW 12.8 12.3 - 15.4 %   Platelets 206 150 - 379 x10E3/uL   Neutrophils 53 Not Estab. %   Lymphs 39 Not Estab. %   MID 8 Not Estab. %   Neutrophils Absolute 3.5 1.4 - 7.0 x10E3/uL   Lymphocytes Absolute 2.6 0.7 - 3.1 x10E3/uL   MID (Absolute) 0.5 0.1 - 1.6 X10E3/uL  UA/M w/rflx Culture, Routine  Result Value Ref Range   Specific Gravity, UA 1.020 1.005 - 1.030   pH, UA 6.0 5.0 - 7.5   Color, UA Yellow Yellow   Appearance Ur Clear Clear   Leukocytes, UA Negative Negative   Protein, UA Negative Negative/Trace   Glucose, UA Negative Negative   Ketones, UA Negative Negative   RBC,  UA Negative Negative   Bilirubin, UA Negative Negative   Urobilinogen, Ur 0.2 0.2 - 1.0 mg/dL   Nitrite, UA Negative Negative      Assessment & Plan:   Problem List Items Addressed This Visit      Unprioritized   Chronic pain syndrome    No change.  Continue present medication      Chronic, continuous use of opioids    Stable.  Will recheck in 3 months.  Update pain contract          Follow up plan: Return in about 3 months (around 04/27/2018) for physical.

## 2018-01-25 NOTE — Assessment & Plan Note (Signed)
Stable.  Will recheck in 3 months.  Update pain contract

## 2018-02-08 ENCOUNTER — Other Ambulatory Visit: Payer: Self-pay | Admitting: Unknown Physician Specialty

## 2018-02-08 MED ORDER — HYDROCODONE-ACETAMINOPHEN 7.5-325 MG PO TABS: 1.0000 | ORAL_TABLET | Freq: Four times a day (QID) | ORAL | 0 refills | Status: DC | PRN

## 2018-02-08 MED ORDER — CLONAZEPAM 0.5 MG PO TABS: 0.5000 mg | ORAL_TABLET | Freq: Every day | ORAL | 0 refills | Status: DC

## 2018-02-17 ENCOUNTER — Other Ambulatory Visit: Payer: Self-pay | Admitting: Unknown Physician Specialty

## 2018-03-06 ENCOUNTER — Other Ambulatory Visit: Payer: Self-pay | Admitting: Unknown Physician Specialty

## 2018-03-06 MED ORDER — CLONAZEPAM 0.5 MG PO TABS
0.5000 mg | ORAL_TABLET | Freq: Every day | ORAL | 0 refills | Status: DC
Start: 2018-03-06 — End: 2018-04-16

## 2018-03-06 MED ORDER — HYDROCODONE-ACETAMINOPHEN 7.5-325 MG PO TABS: 1.0000 | ORAL_TABLET | Freq: Four times a day (QID) | ORAL | 0 refills | Status: DC | PRN

## 2018-03-06 NOTE — Telephone Encounter (Signed)
Hydrocodone-acetaminophen refill Last OV: 01/25/18 Last Refill:02/08/18 #84 tablets no RF [facility]acy:[facility] [address] [place] Kathrine Haddock NP  Clonazepam refill Last OV: 08/28/17 Last Refill:02/08/18 #28 tab no RF [facility]

## 2018-03-06 NOTE — Telephone Encounter (Signed)
Copied from [address] [id number]. Topic: Quick Communication - See Telephone Encounter >> Mar 06, 2018  9:43 AM Marja Kays F wrote: Pt states that he is on a 28 day rx refill for norcor and  klonopin and it is coming time to refill and pt will be out of town and is requesting  it early  [facility] [address] [place] [phone]    Best number [phone]

## 2018-04-09 ENCOUNTER — Other Ambulatory Visit: Payer: Self-pay | Admitting: Unknown Physician Specialty

## 2018-04-09 DIAGNOSIS — M5442 Lumbago with sciatica, left side: Secondary | ICD-10-CM

## 2018-04-09 DIAGNOSIS — G8929 Other chronic pain: Secondary | ICD-10-CM

## 2018-04-09 DIAGNOSIS — G894 Chronic pain syndrome: Secondary | ICD-10-CM

## 2018-04-09 MED ORDER — HYDROCODONE-ACETAMINOPHEN 7.5-325 MG PO TABS: 1.0000 | ORAL_TABLET | Freq: Four times a day (QID) | ORAL | 0 refills | Status: DC | PRN

## 2018-04-09 NOTE — Progress Notes (Signed)
Discussed with pt Norco rx.  We will give him 3 months prescription. Discussed going to the pain clinic for further refills.  He agrees.

## 2018-04-16 ENCOUNTER — Ambulatory Visit: Payer: 59 | Admitting: Unknown Physician Specialty

## 2018-04-16 ENCOUNTER — Other Ambulatory Visit: Payer: Self-pay | Admitting: Unknown Physician Specialty

## 2018-04-16 ENCOUNTER — Encounter: Payer: Self-pay | Admitting: Unknown Physician Specialty

## 2018-04-16 DIAGNOSIS — L219 Seborrheic dermatitis, unspecified: Secondary | ICD-10-CM | POA: Diagnosis not present

## 2018-04-16 DIAGNOSIS — F419 Anxiety disorder, unspecified: Secondary | ICD-10-CM | POA: Diagnosis not present

## 2018-04-16 DIAGNOSIS — M5441 Lumbago with sciatica, right side: Secondary | ICD-10-CM

## 2018-04-16 DIAGNOSIS — G8929 Other chronic pain: Secondary | ICD-10-CM

## 2018-04-16 DIAGNOSIS — F321 Major depressive disorder, single episode, moderate: Secondary | ICD-10-CM | POA: Diagnosis not present

## 2018-04-16 DIAGNOSIS — M5442 Lumbago with sciatica, left side: Secondary | ICD-10-CM

## 2018-04-16 MED ORDER — CLONAZEPAM 0.5 MG PO TABS: 0.5000 mg | ORAL_TABLET | Freq: Every day | ORAL | 0 refills | Status: DC

## 2018-04-16 MED ORDER — MOMETASONE FUROATE 0.1 % EX SOLN: Freq: Every day | CUTANEOUS | 0 refills | Status: DC

## 2018-04-16 MED ORDER — HYDROCODONE-ACETAMINOPHEN 7.5-325 MG PO TABS: 1.0000 | ORAL_TABLET | Freq: Three times a day (TID) | ORAL | 0 refills | Status: DC

## 2018-04-16 MED ORDER — HYDROCODONE-ACETAMINOPHEN 7.5-325 MG PO TABS: 1.0000 | ORAL_TABLET | Freq: Four times a day (QID) | ORAL | 0 refills | Status: DC | PRN

## 2018-04-16 NOTE — Assessment & Plan Note (Signed)
Pt with chronic pain and status post surgery in 2014.  #84 Norco 7.5 mg written 04/09/18, 05/14/18, 06/11/18.  Will refer back to [facility] for further management.

## 2018-04-16 NOTE — Assessment & Plan Note (Signed)
Low dose steroid cream to use sparingly.  Discussed using selsun blue shampoo

## 2018-04-16 NOTE — Assessment & Plan Note (Signed)
No help on multiple medications and failed psychiatric care.  Discussed if pain was better depression would improve.  He would like to consider a spinal cord stimulator.  Refer to [facility] pain management

## 2018-04-16 NOTE — Assessment & Plan Note (Signed)
Taking Clonazepam on occasion.  #30 lasts about 2-3 months

## 2018-04-16 NOTE — Progress Notes (Signed)
BP 133/82   Pulse 76   Temp 98.2 F (36.8 C) (Oral)   Ht 5\' 8"  (1.727 m)   Wt 185 lb 8 oz (84.1 kg)   SpO2 97%   BMI 28.21 kg/m    Subjective:    Patient ID: William Herring, male    DOB: 1971/08/18, 47 y.o.   MRN: [id number]  HPI: William Herring is a 47 y.o. male  Chief Complaint  Patient presents with  . Depression  . Pain   Chronic back Patient is under chronic opioid management for care for chronic back pain. Has was [facility] [place] [facility] [place].His is staus post Hemilaminectomy and discectomy in 2014.He describes pain from his low back, to hisbuttocks to the back of his knee.Describes it as a sharp burning pain.He is also taking Gabapentin which helps to "dull it." In addition, he takes Vicoden 7.5mg /300 TID.  Depression Depression is still a problem.  We've tried a number of different medications.  He has also tried seeing psychiatry which hasn't helped.  He is not taking Citalopram.  Only taking 30 mg Cymbalta and feels he is the same as on 60 mg.  Planning on tapering.   Depression screen [facility] 2/9 04/16/2018 01/25/2018 12/04/2017 08/28/2017 05/08/2017  Decreased Interest 2 3 3 3 3   Down, Depressed, Hopeless 2 2 2 3 2   PHQ - 2 Score 4 5 5 6 5   Altered sleeping 1 1 1 1 1   Tired, decreased energy 2 3 3 3 3   Change in appetite 0 1 1 2 1   Feeling bad or failure about yourself  1 2 2 2 1   Trouble concentrating 0 0 0 1 2  Moving slowly or fidgety/restless 1 1 1  0 0  Suicidal thoughts 0 0 0 0 0  PHQ-9 Score 9 13 13 15 13   Difficult doing work/chores - - - Extremely dIfficult -  Some recent data might be hidden   Dry skin in eyebrow.  Has tried a number of things but nothing has worked.  Has been there for a year.  No exacerbating or alleviating problems.    Relevant past medical, surgical, family and social history reviewed and updated as indicated. Interim medical history since our last visit reviewed. Allergies  and medications reviewed and updated.  Review of Systems  Per HPI unless specifically indicated above     Objective:    BP 133/82   Pulse 76   Temp 98.2 F (36.8 C) (Oral)   Ht 5\' 8"  (1.727 m)   Wt 185 lb 8 oz (84.1 kg)   SpO2 97%   BMI 28.21 kg/m   Wt Readings from Last 3 Encounters:  04/16/18 185 lb 8 oz (84.1 kg)  01/25/18 186 lb 11.2 oz (84.7 kg)  12/04/17 183 lb 6.4 oz (83.2 kg)    Physical Exam  Constitutional: He is oriented to person, place, and time. He appears well-developed and well-nourished. No distress.  HENT:  Head: Normocephalic and atraumatic.  Eyes: Conjunctivae and lids are normal. Right eye exhibits no discharge. Left eye exhibits no discharge. No scleral icterus.  Cardiovascular: Normal rate.  Pulmonary/Chest: Effort normal.  Abdominal: Normal appearance. There is no splenomegaly or hepatomegaly.  Musculoskeletal: Normal range of motion.  Neurological: He is alert and oriented to person, place, and time.  Skin: Skin is intact. No rash noted. No pallor.  Psychiatric: He has a normal mood and affect. His behavior is normal. Judgment and  thought content normal.    Results for orders placed or performed in visit on 12/04/17  CBC With Differential/Platelet  Result Value Ref Range   WBC 6.6 3.4 - 10.8 x10E3/uL   RBC 4.95 4.14 - 5.80 x10E6/uL   Hemoglobin 15.6 13.0 - 17.7 g/dL   Hematocrit 42.7 37.5 - 51.0 %   MCV 86 79 - 97 fL   MCH 31.5 26.6 - 33.0 pg   MCHC 36.5 (H) 31.5 - 35.7 g/dL   RDW 12.8 12.3 - 15.4 %   Platelets 206 150 - 379 x10E3/uL   Neutrophils 53 Not Estab. %   Lymphs 39 Not Estab. %   MID 8 Not Estab. %   Neutrophils Absolute 3.5 1.4 - 7.0 x10E3/uL   Lymphocytes Absolute 2.6 0.7 - 3.1 x10E3/uL   MID (Absolute) 0.5 0.1 - 1.6 X10E3/uL  UA/M w/rflx Culture, Routine  Result Value Ref Range   Specific Gravity, UA 1.020 1.005 - 1.030   pH, UA 6.0 5.0 - 7.5   Color, UA Yellow Yellow   Appearance Ur Clear Clear   Leukocytes, UA  Negative Negative   Protein, UA Negative Negative/Trace   Glucose, UA Negative Negative   Ketones, UA Negative Negative   RBC, UA Negative Negative   Bilirubin, UA Negative Negative   Urobilinogen, Ur 0.2 0.2 - 1.0 mg/dL   Nitrite, UA Negative Negative      Assessment & Plan:   Problem List Items Addressed This Visit      Unprioritized   Anxiety    Taking Clonazepam on occasion.  #30 lasts about 2-3 months      Relevant Medications   DULoxetine (CYMBALTA) 30 MG capsule   Chronic back pain    Pt with chronic pain and status post surgery in 2014.  #84 Norco 7.5 mg written 04/09/18, 05/14/18, 06/11/18.  Will refer back to [facility] for further management.        Relevant Medications   HYDROcodone-acetaminophen (NORCO) 7.5-325 MG tablet (Start on 05/14/2018)   HYDROcodone-acetaminophen ([place]) 7.5-325 MG tablet (Start on 06/11/2018)   Other Relevant Orders   Ambulatory referral to Pain Clinic   Depression    No help on multiple medications and failed psychiatric care.  Discussed if pain was better depression would improve.  He would like to consider a spinal cord stimulator.  Refer to [facility] pain management      Relevant Medications   DULoxetine (CYMBALTA) 30 MG capsule   Seborrhea    Low dose steroid cream to use sparingly.  Discussed using selsun blue shampoo          Follow up plan: Return in about 6 months (around 10/16/2018).

## 2018-04-17 NOTE — Telephone Encounter (Signed)
Hydrocortisone Butyrate 0.1 % soln      Pharmacy requesting directions, refills and quantity.   Requesting an alternative:  Only available in solution, cream or ointment.  [facility] [place] - [address]

## 2018-04-23 ENCOUNTER — Telehealth: Payer: Self-pay

## 2018-04-23 NOTE — Telephone Encounter (Signed)
Copied from [place] [id number]. Topic: Referral - Question >> Apr 23, 2018 11:26 AM Neva Seat wrote: Pt was supposed to be referred to the [facility].  His referral was accidentally made to the [facility]. Pt needing a new referral for an appt.   Referral re-faxed to [facility] [place]. Will call and apologize to the patient and let him know.

## 2018-04-23 NOTE — Telephone Encounter (Signed)
Called and left patient a VM apologizing about mix up with referral. Let patient know that I have faxed his referral to [facility] [facility] so they should be contacting him about an appointment. Asked for the patient to give me a call back if he has any questions or concerns.

## 2018-05-07 ENCOUNTER — Ambulatory Visit: Payer: 59 | Admitting: Unknown Physician Specialty

## 2018-05-07 ENCOUNTER — Other Ambulatory Visit: Payer: Self-pay | Admitting: Unknown Physician Specialty

## 2018-05-07 MED ORDER — HYDROCODONE-ACETAMINOPHEN 7.5-325 MG PO TABS: 1.0000 | ORAL_TABLET | Freq: Four times a day (QID) | ORAL | 0 refills | Status: DC | PRN

## 2018-05-07 MED ORDER — HYDROCODONE-ACETAMINOPHEN 7.5-325 MG PO TABS: 1.0000 | ORAL_TABLET | Freq: Three times a day (TID) | ORAL | 0 refills | Status: DC

## 2018-05-07 NOTE — Progress Notes (Signed)
Pt states the dates of his Hydrocodone auto refills were off by one week.  Refills reordered for today and another on 7/30

## 2018-06-17 ENCOUNTER — Encounter: Payer: Self-pay | Admitting: Physician Assistant

## 2018-06-17 ENCOUNTER — Ambulatory Visit: Payer: 59 | Admitting: Physician Assistant

## 2018-06-17 VITALS — BP 142/91 | HR 76 | Temp 98.9°F | Ht 68.0 in | Wt 187.8 lb

## 2018-06-17 DIAGNOSIS — G8929 Other chronic pain: Secondary | ICD-10-CM

## 2018-06-17 DIAGNOSIS — M5441 Lumbago with sciatica, right side: Secondary | ICD-10-CM

## 2018-06-17 DIAGNOSIS — F321 Major depressive disorder, single episode, moderate: Secondary | ICD-10-CM

## 2018-06-17 DIAGNOSIS — M5442 Lumbago with sciatica, left side: Secondary | ICD-10-CM | POA: Diagnosis not present

## 2018-06-17 DIAGNOSIS — F419 Anxiety disorder, unspecified: Secondary | ICD-10-CM

## 2018-06-17 DIAGNOSIS — K219 Gastro-esophageal reflux disease without esophagitis: Secondary | ICD-10-CM

## 2018-06-17 MED ORDER — DULOXETINE HCL 60 MG PO CPEP: 60.0000 mg | ORAL_CAPSULE | Freq: Every day | ORAL | 0 refills | Status: DC

## 2018-06-17 NOTE — Progress Notes (Signed)
Subjective:    Patient ID: William Herring, male    DOB: 12-27-1970, 47 y.o.   MRN: [id number]  William Herring is a 47 y.o. male presenting on 06/17/2018 for Depression   HPI  Patient is reporting today for follow up of anxiety and depression. He has been taking Cymablta 30 mg daily. He reports it has helped. He would like to increase this medication.  He reports taking Klonopin 1-2 times per month.   Also taking gabapentin 600 mg TID for low back pain, has been taking this for many years.   Heartburn: stable with prilosec.   He is currently seeing pain management for chronic low back pain.   Social History   Tobacco Use  . Smoking status: Former Research scientist (life sciences)  . Smokeless tobacco: Former Systems developer    Types: Chew  Substance Use Topics  . Alcohol use: Yes    Alcohol/week: 0.0 standard drinks    Comment: on occasion  . Drug use: No    Review of Systems Per HPI unless specifically indicated above     Objective:    BP (!) 142/91   Pulse 76   Temp 98.9 F (37.2 C) (Oral)   Ht 5\' 8"  (1.727 m)   Wt 187 lb 12.8 oz (85.2 kg)   SpO2 98%   BMI 28.55 kg/m   Wt Readings from Last 3 Encounters:  06/17/18 187 lb 12.8 oz (85.2 kg)  04/16/18 185 lb 8 oz (84.1 kg)  01/25/18 186 lb 11.2 oz (84.7 kg)    Physical Exam  Constitutional: He is oriented to person, place, and time. He appears well-developed and well-nourished.  Cardiovascular: Normal rate.  Pulmonary/Chest: Effort normal.  Neurological: He is alert and oriented to person, place, and time.  Skin: Skin is warm and dry.  Psychiatric: He has a normal mood and affect. His behavior is normal.   Results for orders placed or performed in visit on 12/04/17  CBC With Differential/Platelet  Result Value Ref Range   WBC 6.6 3.4 - 10.8 x10E3/uL   RBC 4.95 4.14 - 5.80 x10E6/uL   Hemoglobin 15.6 13.0 - 17.7 g/dL   Hematocrit 42.7 37.5 - 51.0 %   MCV 86 79 - 97 fL   MCH 31.5 26.6 - 33.0 pg   MCHC 36.5 (H) 31.5 - 35.7 g/dL   RDW  12.8 12.3 - 15.4 %   Platelets 206 150 - 379 x10E3/uL   Neutrophils 53 Not Estab. %   Lymphs 39 Not Estab. %   MID 8 Not Estab. %   Neutrophils Absolute 3.5 1.4 - 7.0 x10E3/uL   Lymphocytes Absolute 2.6 0.7 - 3.1 x10E3/uL   MID (Absolute) 0.5 0.1 - 1.6 X10E3/uL  UA/M w/rflx Culture, Routine  Result Value Ref Range   Specific Gravity, UA 1.020 1.005 - 1.030   pH, UA 6.0 5.0 - 7.5   Color, UA Yellow Yellow   Appearance Ur Clear Clear   Leukocytes, UA Negative Negative   Protein, UA Negative Negative/Trace   Glucose, UA Negative Negative   Ketones, UA Negative Negative   RBC, UA Negative Negative   Bilirubin, UA Negative Negative   Urobilinogen, Ur 0.2 0.2 - 1.0 mg/dL   Nitrite, UA Negative Negative      Assessment & Plan:  1. Anxiety  Increase cymbalta to 60 mg daily.   2. Current moderate episode of major depressive disorder without prior episode ([place])  See above.  3. Chronic Back Pain  Using gabapentin, sees pain  management  4. GERD  Stable on prilosec.      Follow up plan: Return in about 3 months (around 09/17/2018) for anxiety and depression .  Carles Collet, PA-C [facility] 06/17/2018, 5:14 PM

## 2018-08-23 ENCOUNTER — Other Ambulatory Visit: Payer: Self-pay | Admitting: Unknown Physician Specialty

## 2018-09-08 ENCOUNTER — Other Ambulatory Visit: Payer: Self-pay | Admitting: Physician Assistant

## 2018-09-09 NOTE — Telephone Encounter (Signed)
Refill request approved.  Has f/u 09/16/18.

## 2018-09-16 ENCOUNTER — Encounter: Payer: Self-pay | Admitting: Nurse Practitioner

## 2018-09-16 ENCOUNTER — Ambulatory Visit: Payer: 59 | Admitting: Nurse Practitioner

## 2018-09-16 ENCOUNTER — Other Ambulatory Visit: Payer: Self-pay

## 2018-09-16 VITALS — BP 127/88 | HR 86 | Temp 98.5°F | Ht 68.0 in | Wt 186.0 lb

## 2018-09-16 DIAGNOSIS — F419 Anxiety disorder, unspecified: Secondary | ICD-10-CM | POA: Diagnosis not present

## 2018-09-16 DIAGNOSIS — F321 Major depressive disorder, single episode, moderate: Secondary | ICD-10-CM | POA: Diagnosis not present

## 2018-09-16 MED ORDER — DULOXETINE HCL 60 MG PO CPEP: ORAL_CAPSULE | ORAL | 3 refills | Status: DC

## 2018-09-16 NOTE — Patient Instructions (Signed)
Buspirone tablets What is this medicine? BUSPIRONE (byoo SPYE rone) is used to treat anxiety disorders. This medicine may be used for other purposes; ask your health care provider or pharmacist if you have questions. COMMON BRAND NAME(S): BuSpar What should I tell my health care provider before I take this medicine? They need to know if you have any of these conditions: -kidney or liver disease -an unusual or allergic reaction to buspirone, other medicines, foods, dyes, or preservatives -pregnant or trying to get pregnant -breast-feeding How should I use this medicine? Take this medicine by mouth with a glass of water. Follow the directions on the prescription label. You may take this medicine with or without food. To ensure that this medicine always works the same way for you, you should take it either always with or always without food. Take your doses at regular intervals. Do not take your medicine more often than directed. Do not stop taking except on the advice of your doctor or health care professional. Talk to your pediatrician regarding the use of this medicine in children. Special care may be needed. Overdosage: If you think you have taken too much of this medicine contact a poison control center or emergency room at once. NOTE: This medicine is only for you. Do not share this medicine with others. What if I miss a dose? If you miss a dose, take it as soon as you can. If it is almost time for your next dose, take only that dose. Do not take double or extra doses. What may interact with this medicine? Do not take this medicine with any of the following medications: -linezolid -MAOIs like Carbex, Eldepryl, Marplan, Nardil, and Parnate -methylene blue -procarbazine This medicine may also interact with the following medications: -diazepam -digoxin -diltiazem -erythromycin -grapefruit juice -haloperidol -medicines for mental depression or mood problems -medicines for seizures like  carbamazepine, phenobarbital and phenytoin -nefazodone -other medications for anxiety -rifampin -ritonavir -some antifungal medicines like itraconazole, ketoconazole, and voriconazole -verapamil -warfarin This list may not describe all possible interactions. Give your health care provider a list of all the medicines, herbs, non-prescription drugs, or dietary supplements you use. Also tell them if you smoke, drink alcohol, or use illegal drugs. Some items may interact with your medicine. What should I watch for while using this medicine? Visit your doctor or health care professional for regular checks on your progress. It may take 1 to 2 weeks before your anxiety gets better. You may get drowsy or dizzy. Do not drive, use machinery, or do anything that needs mental alertness until you know how this drug affects you. Do not stand or sit up quickly, especially if you are an older patient. This reduces the risk of dizzy or fainting spells. Alcohol can make you more drowsy and dizzy. Avoid alcoholic drinks. What side effects may I notice from receiving this medicine? Side effects that you should report to your doctor or health care professional as soon as possible: -blurred vision or other vision changes -chest pain -confusion -difficulty breathing -feelings of hostility or anger -muscle aches and pains -numbness or tingling in hands or feet -ringing in the ears -skin rash and itching -vomiting -weakness Side effects that usually do not require medical attention (report to your doctor or health care professional if they continue or are bothersome): -disturbed dreams, nightmares -headache -nausea -restlessness or nervousness -sore throat and nasal congestion -stomach upset This list may not describe all possible side effects. Call your doctor for medical advice about side   effects. You may report side effects to FDA at 1-800-FDA-1088. Where should I keep my medicine? Keep out of the reach  of children. Store at room temperature below 30 degrees C (86 degrees F). Protect from light. Keep container tightly closed. Throw away any unused medicine after the expiration date. NOTE: This sheet is a summary. It may not cover all possible information. If you have questions about this medicine, talk to your doctor, pharmacist, or health care provider.  2018 Elsevier/Gold Standard (2010-06-02 18:06:11)  

## 2018-09-16 NOTE — Assessment & Plan Note (Signed)
Chronic, continues to take Clonopin but on minimal basis.  Does not wish refill today, as states he has lots left.  Has been using less frequently.  Provided information on Buspar.

## 2018-09-16 NOTE — Assessment & Plan Note (Signed)
Chronic, ongoing.  Continue current medication regimen and follow-up appointments with pain clinic.

## 2018-09-16 NOTE — Progress Notes (Signed)
BP 127/88   Pulse 86   Temp 98.5 F (36.9 C) (Oral)   Ht 5\' 8"  (1.727 m)   Wt 186 lb (84.4 kg)   SpO2 98%   BMI 28.28 kg/m    Subjective:    Patient ID: William Herring, male    DOB: [birth year], 47 y.o.   MRN: [id number]  HPI: William Herring is a 47 y.o. male presents for follow-up depression/anxiety  Chief Complaint  Patient presents with  . Anxiety    74m f/u  . Depression   DEPRESSION/ANXIETY: He reports he has not taken Klonopin in a week or so because of concerns for memory loss he read about months ago, so has been using less frequently.  He quit taking it every day three months.  Discussed alternate options with patient, such as Buspar.  He wishes to continue Klonopin at this time, but states he does not need refills yet, as he has been using medication less frequently.   Mood status: controlled Satisfied with current treatment?: yes Symptom severity: mild  Duration of current treatment : chronic Side effects: no Medication compliance: excellent compliance Psychotherapy/counseling: has not ever gone to therapy Previous psychiatric medications:does not recall Anxious mood: yes Anhedonia: no Significant weight loss or gain: no Insomnia: yes hard to fall asleep Fatigue: no Feelings of worthlessness or guilt: no Impaired concentration/indecisiveness: no Suicidal ideations: no Hopelessness: no Crying spells: no Depression screen [facility] 2/9 09/16/2018 04/16/2018 01/25/2018 12/04/2017 08/28/2017  Decreased Interest 2 2 3 3 3   Down, Depressed, Hopeless 3 2 2 2 3   PHQ - 2 Score 5 4 5 5 6   Altered sleeping 1 1 1 1 1   Tired, decreased energy 3 2 3 3 3   Change in appetite 2 0 1 1 2   Feeling bad or failure about yourself  2 1 2 2 2   Trouble concentrating 1 0 0 0 1  Moving slowly or fidgety/restless 1 1 1 1  0  Suicidal thoughts 0 0 0 0 0  PHQ-9 Score 15 9 13 13 15   Difficult doing work/chores Somewhat difficult - - - Extremely dIfficult  Some recent data might be  hidden   GAD 7 : Generalized Anxiety Score 09/16/2018  Nervous, Anxious, on Edge 0  Control/stop worrying 1  Worry too much - different things 1  Trouble relaxing 0  Restless 0  Easily annoyed or irritable 2  Afraid - awful might happen 0  Total GAD 7 Score 4  Anxiety Difficulty Somewhat difficult     Relevant past medical, surgical, family and social history reviewed and updated as indicated. Interim medical history since our last visit reviewed. Allergies and medications reviewed and updated.  Review of Systems  Constitutional: Negative for activity change, chills, diaphoresis, fatigue and fever.  Respiratory: Negative for cough, chest tightness, shortness of breath and wheezing.   Cardiovascular: Negative for chest pain, palpitations and leg swelling.  Gastrointestinal: Negative for abdominal distention, abdominal pain, constipation, diarrhea, nausea and vomiting.  Musculoskeletal: Negative.   Skin: Negative.   Neurological: Negative for dizziness, syncope, facial asymmetry, weakness, light-headedness and headaches.  Psychiatric/Behavioral: Negative for behavioral problems, confusion, self-injury, sleep disturbance and suicidal ideas. The patient is nervous/anxious.     Per HPI unless specifically indicated above     Objective:    BP 127/88   Pulse 86   Temp 98.5 F (36.9 C) (Oral)   Ht 5\' 8"  (1.727 m)   Wt 186 lb (84.4 kg)   SpO2 98%  BMI 28.28 kg/m   Wt Readings from Last 3 Encounters:  09/16/18 186 lb (84.4 kg)  06/17/18 187 lb 12.8 oz (85.2 kg)  04/16/18 185 lb 8 oz (84.1 kg)    Physical Exam  Constitutional: He is oriented to person, place, and time. He appears well-developed and well-nourished.  HENT:  Head: Normocephalic and atraumatic.  Eyes: Pupils are equal, round, and reactive to light. Conjunctivae and EOM are normal.  Neck: Normal range of motion. Neck supple. No JVD present. No thyromegaly present.  Cardiovascular: Normal rate, regular rhythm  and normal heart sounds.  Pulmonary/Chest: Effort normal and breath sounds normal.  Abdominal: Soft. Bowel sounds are normal.  Lymphadenopathy:    He has no cervical adenopathy.  Neurological: He is alert and oriented to person, place, and time.  Skin: Skin is warm and dry.  Psychiatric: He has a normal mood and affect. His behavior is normal. Judgment and thought content normal.  Nursing note and vitals reviewed.   Results for orders placed or performed in visit on 12/04/17  CBC With Differential/Platelet  Result Value Ref Range   WBC 6.6 3.4 - 10.8 x10E3/uL   RBC 4.95 4.14 - 5.80 x10E6/uL   Hemoglobin 15.6 13.0 - 17.7 g/dL   Hematocrit 42.7 37.5 - 51.0 %   MCV 86 79 - 97 fL   MCH 31.5 26.6 - 33.0 pg   MCHC 36.5 (H) 31.5 - 35.7 g/dL   RDW 12.8 12.3 - 15.4 %   Platelets 206 150 - 379 x10E3/uL   Neutrophils 53 Not Estab. %   Lymphs 39 Not Estab. %   MID 8 Not Estab. %   Neutrophils Absolute 3.5 1.4 - 7.0 x10E3/uL   Lymphocytes Absolute 2.6 0.7 - 3.1 x10E3/uL   MID (Absolute) 0.5 0.1 - 1.6 X10E3/uL  UA/M w/rflx Culture, Routine  Result Value Ref Range   Specific Gravity, UA 1.020 1.005 - 1.030   pH, UA 6.0 5.0 - 7.5   Color, UA Yellow Yellow   Appearance Ur Clear Clear   Leukocytes, UA Negative Negative   Protein, UA Negative Negative/Trace   Glucose, UA Negative Negative   Ketones, UA Negative Negative   RBC, UA Negative Negative   Bilirubin, UA Negative Negative   Urobilinogen, Ur 0.2 0.2 - 1.0 mg/dL   Nitrite, UA Negative Negative      Assessment & Plan:   Problem List Items Addressed This Visit      Other   Anxiety    Chronic, continues to take Clonopin but on minimal basis.  Does not wish refill today, as states he has lots left.  Has been using less frequently.  Provided information on Buspar.      Relevant Medications   DULoxetine (CYMBALTA) 60 MG capsule   Depression    Chronic, ongoing.  Continue current medication regimen and follow-up appointments  with pain clinic.      Relevant Medications   DULoxetine (CYMBALTA) 60 MG capsule       Follow up plan: Return in about 3 months (around 12/17/2018) for Depression and Anxiety.

## 2018-12-23 ENCOUNTER — Ambulatory Visit: Payer: 59 | Admitting: Nurse Practitioner

## 2019-08-21 ENCOUNTER — Other Ambulatory Visit: Payer: Self-pay | Admitting: Unknown Physician Specialty

## 2019-12-04 ENCOUNTER — Other Ambulatory Visit: Payer: Self-pay | Admitting: Nurse Practitioner

## 2019-12-04 NOTE — Telephone Encounter (Signed)
Forwarding medication refill request to the PCP for review.

## 2019-12-29 ENCOUNTER — Other Ambulatory Visit: Payer: Self-pay | Admitting: Nurse Practitioner

## 2019-12-29 NOTE — Telephone Encounter (Signed)
Requested medication (s) are due for refill today: yes  Requested medication (s) are on the active medication list: yes  Last refill:  12/04/19  Future visit scheduled: no  Notes to clinic: no valid encounter within last 6 months    Requested Prescriptions  Pending Prescriptions Disp Refills   DULoxetine (CYMBALTA) 60 MG capsule [Pharmacy Med Name: DULOXETINE HCL DR 60 MG CAP] 90 capsule 1    Sig: TAKE 1 CAPSULE BY MOUTH EVERY DAY.  Need appointment for further refills.      Psychiatry: Antidepressants - SNRI Failed - 12/29/2019 10:31 AM      Failed - Completed PHQ-2 or PHQ-9 in the last 360 days.      Failed - Valid encounter within last 6 months    Recent Outpatient Visits           1 year ago Current moderate episode of major depressive disorder without prior episode ([place])   [facility], Barbaraann Faster, NP   1 year ago [place], PA-C   1 year ago Chronic bilateral low back pain with bilateral sciatica   [facility] Kathrine Haddock, NP   1 year ago Chronic, continuous use of opioids   [facility] Kathrine Haddock, NP   2 years ago Generalized abdominal pain   [place], NP              Passed - Last BP in normal range    BP Readings from Last 1 Encounters:  09/16/18 127/88

## 2020-01-01 ENCOUNTER — Encounter: Payer: Self-pay | Admitting: Nurse Practitioner

## 2020-01-01 ENCOUNTER — Telehealth (INDEPENDENT_AMBULATORY_CARE_PROVIDER_SITE_OTHER): Payer: 59 | Admitting: Nurse Practitioner

## 2020-01-01 DIAGNOSIS — E781 Pure hyperglyceridemia: Secondary | ICD-10-CM

## 2020-01-01 DIAGNOSIS — F1190 Opioid use, unspecified, uncomplicated: Secondary | ICD-10-CM

## 2020-01-01 DIAGNOSIS — F419 Anxiety disorder, unspecified: Secondary | ICD-10-CM

## 2020-01-01 DIAGNOSIS — F322 Major depressive disorder, single episode, severe without psychotic features: Secondary | ICD-10-CM

## 2020-01-01 DIAGNOSIS — G894 Chronic pain syndrome: Secondary | ICD-10-CM

## 2020-01-01 DIAGNOSIS — K219 Gastro-esophageal reflux disease without esophagitis: Secondary | ICD-10-CM

## 2020-01-01 DIAGNOSIS — R5383 Other fatigue: Secondary | ICD-10-CM

## 2020-01-01 MED ORDER — DULOXETINE HCL 60 MG PO CPEP: ORAL_CAPSULE | ORAL | 3 refills | Status: DC

## 2020-01-01 NOTE — Assessment & Plan Note (Signed)
Chronic, continues to take Clonopin but on minimal basis, last fill June 2019.  Does not wish refill today, as states he has lots left.  Has been using less frequently.  Provided information on Buspar, which would be alternate option in future if requires anxiety medication.  Discussed at length risks of opioid/benzo use.

## 2020-01-01 NOTE — Progress Notes (Signed)
There were no vitals taken for this visit.   Subjective:    Patient ID: William Herring, male    DOB: 1971/03/30, 49 y.o.   MRN: [id number]  HPI: William Herring is a 49 y.o. male  Chief Complaint  Patient presents with  . Anxiety  . Depression    . This visit was completed via MyChart due to the restrictions of the COVID-19 pandemic. All issues as above were discussed and addressed. Physical exam was done as above through visual confirmation on MyChart. If it was felt that the patient should be evaluated in the office, they were directed there. The patient verbally consented to this visit. . Location of the patient: home . Location of the provider: home . Those involved with this call:  . Provider: Marnee Guarneri, DNP . CMA: Yvonna Alanis, CMA . Front Desk/Registration: Don Perking  . Time spent on call: 15 minutes with patient face to face via video conference. More than 50% of this time was spent in counseling and coordination of care. 10 minutes total spent in review of patient's record and preparation of their chart.  . I verified patient identity using two factors (patient name and date of birth). Patient consents verbally to being seen via telemedicine visit today.    ANXIETY/STRESS Continues on Duloxetine and occasional Klonopin, last visit he had reported cutting back on this due to concerns for memory loss.  Has taken a couple times since last visit, but does not need refills.  He is also followed by pain clinic for chronic pain and taking occasional Norco.  Pt is aware of risks of benzo medication use to include increased sedation, respiratory suppression, falls, extrapyramidal movements,  dependence and cardiovascular events.  Pt would like to continue treatment as benefit determined to outweigh risk; however he is cutting back and goal is to discontinue.  Discussed at length with him not to take at same time as Norco and the risks of benzo/opioid use.  On  review PMP last Klonopin fill was 04/16/2018 for #28 pills and last Norco fill 12/17/2019 for #90 pills by pain management.  He does endorse ongoing chronic fatigue with normal sleep pattern.   Duration:stable Anxious mood: no  Excessive worrying: no Irritability: no  Sweating: no Nausea: no Palpitations:no Hyperventilation: no Panic attacks: no Agoraphobia: no  Obscessions/compulsions: no Depressed mood: no Depression screen [facility] 2/9 01/01/2020 09/16/2018 04/16/2018 01/25/2018 12/04/2017  Decreased Interest 2 2 2 3 3   Down, Depressed, Hopeless 0 3 2 2 2   PHQ - 2 Score 2 5 4 5 5   Altered sleeping 0 1 1 1 1   Tired, decreased energy 3 3 2 3 3   Change in appetite 0 2 0 1 1  Feeling bad or failure about yourself  0 2 1 2 2   Trouble concentrating 0 1 0 0 0  Moving slowly or fidgety/restless 0 1 1 1 1   Suicidal thoughts 0 0 0 0 0  PHQ-9 Score 5 15 9 13 13   Difficult doing work/chores Not difficult at all Somewhat difficult - - -  Some recent data might be hidden   Anhedonia: no Weight changes: no Insomnia: none Hypersomnia: no Fatigue/loss of energy: yes Feelings of worthlessness: no Feelings of guilt: no Impaired concentration/indecisiveness: yes Suicidal ideations: no  Crying spells: no Recent Stressors/Life Changes: no   Relationship problems: no   Family stress: no     Financial stress: no    Job stress: no    Recent death/loss:  no GAD 7 : Generalized Anxiety Score 01/01/2020 09/16/2018  Nervous, Anxious, on Edge 0 0  Control/stop worrying 0 1  Worry too much - different things 0 1  Trouble relaxing 0 0  Restless 0 0  Easily annoyed or irritable 0 2  Afraid - awful might happen 0 0  Total GAD 7 Score 0 4  Anxiety Difficulty Not difficult at all Somewhat difficult   CHRONIC PAIN  Followed by pain clinic and last seen 12/18/2019 at [facility].  Takes Duloxetine, Gabapentin, and occasional Norco.  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 Pain control status: stable Duration: chronic Location: lower back Quality: dull and aching Breakthrough pain: no Benefit from narcotic medications: yes What Activities task can be accomplished with current medication? Can perform all ADLs and work daily Interested in weaning off narcotics:no   Stool softners/OTC fiber: no  Previous pain specialty evaluation: yes Non-narcotic analgesic meds: yes Narcotic contract: with pain clinic  GERD Continues on Prilosec.  No heart burn with this regimen. GERD control status: stableSatisfied with current treatment? yes Heartburn frequency: none Medication side effects: no  Medication compliance: stable Dysphagia: no Odynophagia:  no Hematemesis: no Blood in stool: no EGD: no  Relevant past medical, surgical, family and social history reviewed and updated as indicated. Interim medical history since our last visit reviewed. Allergies and medications reviewed and updated.  Review of Systems  Constitutional: Negative for activity change, diaphoresis, fatigue and fever.  Respiratory: Negative for cough, chest tightness, shortness of breath and wheezing.   Cardiovascular: Negative for chest pain, palpitations and leg swelling.  Gastrointestinal: Negative.   Musculoskeletal: Positive for back pain.  Neurological: Negative.   Psychiatric/Behavioral: Positive for decreased concentration. Negative for self-injury, sleep disturbance and suicidal ideas. The patient is not nervous/anxious.     Per HPI unless specifically indicated above     Objective:    There were no vitals taken for this visit.  Wt Readings from Last 3 Encounters:  09/16/18 186 lb (84.4 kg)  06/17/18 187 lb 12.8 oz (85.2 kg)  04/16/18 185 lb 8 oz (84.1 kg)    Physical Exam Vitals and nursing note reviewed.  Constitutional:      General: He is awake. He is not in acute distress.    Appearance: He is well-developed. He is not ill-appearing.   HENT:     Head: Normocephalic.     Right Ear: Hearing normal. No drainage.     Left Ear: Hearing normal. No drainage.  Eyes:     General: Lids are normal.        Right eye: No discharge.        Left eye: No discharge.     Conjunctiva/sclera: Conjunctivae normal.  Pulmonary:     Effort: Pulmonary effort is normal. No accessory muscle usage or respiratory distress.  Musculoskeletal:     Cervical back: Normal range of motion.  Neurological:     Mental Status: He is alert and oriented to person, place, and time.  Psychiatric:        Mood and Affect: Mood normal.        Behavior: Behavior normal. Behavior is cooperative.        Thought Content: Thought content normal.        Judgment: Judgment normal.     Results for orders placed or performed in visit on 12/04/17  CBC With Differential/Platelet  Result Value Ref Range   WBC 6.6  3.4 - 10.8 x10E3/uL   RBC 4.95 4.14 - 5.80 x10E6/uL   Hemoglobin 15.6 13.0 - 17.7 g/dL   Hematocrit 42.7 37.5 - 51.0 %   MCV 86 79 - 97 fL   MCH 31.5 26.6 - 33.0 pg   MCHC 36.5 (H) 31.5 - 35.7 g/dL   RDW 12.8 12.3 - 15.4 %   Platelets 206 150 - 379 x10E3/uL   Neutrophils 53 Not Estab. %   Lymphs 39 Not Estab. %   MID 8 Not Estab. %   Neutrophils Absolute 3.5 1.4 - 7.0 x10E3/uL   Lymphocytes Absolute 2.6 0.7 - 3.1 x10E3/uL   MID (Absolute) 0.5 0.1 - 1.6 X10E3/uL  UA/M w/rflx Culture, Routine   BLD  Result Value Ref Range   Specific Gravity, UA 1.020 1.005 - 1.030   pH, UA 6.0 5.0 - 7.5   Color, UA Yellow Yellow   Appearance Ur Clear Clear   Leukocytes, UA Negative Negative   Protein, UA Negative Negative/Trace   Glucose, UA Negative Negative   Ketones, UA Negative Negative   RBC, UA Negative Negative   Bilirubin, UA Negative Negative   Urobilinogen, Ur 0.2 0.2 - 1.0 mg/dL   Nitrite, UA Negative Negative      Assessment & Plan:   Problem List Items Addressed This Visit      Digestive   GERD (gastroesophageal reflux disease)     Chronic, stable.  Continue current medication regimen and adjust as needed.  Will obtain Mag level outpatient.      Relevant Orders   Magnesium     Other   Chronic pain syndrome    Followed by pain clinic at [facility].      Relevant Medications   HYDROcodone-acetaminophen (NORCO) 7.5-325 MG tablet (Start on 01/13/2020)   DULoxetine (CYMBALTA) 60 MG capsule   Anxiety    Chronic, continues to take Clonopin but on minimal basis, last fill June 2019.  Does not wish refill today, as states he has lots left.  Has been using less frequently.  Provided information on Buspar, which would be alternate option in future if requires anxiety medication.  Discussed at length risks of opioid/benzo use.      Relevant Medications   DULoxetine (CYMBALTA) 60 MG capsule   Chronic, continuous use of opioids    Followed by pain clinic at [facility].      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  Denis SI/HI.  Continue current medication regimen and adjust as needed.  Continues to take Clonopin but on minimal basis, last refill in June 2019.  Does not wish refill today, as states he has lots left.  Has been using less frequently.  Provided information on Buspar, which may be alternate option for future use.  Refills sent on Duloxetine.  Will obtain outpatient labs.  Return in 6 months.      Relevant Medications   DULoxetine (CYMBALTA) 60 MG capsule   Other Relevant Orders   Testosterone, Free, Total, SHBG   TSH    Other Visit Diagnoses    Hypertriglyceridemia       Noted on past labs, will check CMP and Lipid panel outpatient.   Relevant Orders   Comprehensive metabolic panel   Lipid Panel w/o Chol/HDL Ratio   Fatigue, unspecified type       Ongoing for years, suspect related to mood and pain, but will check testosterone and TSH levels.   Relevant Orders   Testosterone, Free, Total,  SHBG   TSH      I discussed the assessment and treatment plan with the patient. The  patient was provided an opportunity to ask questions and all were answered. The patient agreed with the plan and demonstrated an understanding of the instructions.   The patient was advised to call back or seek an in-person evaluation if the symptoms worsen or if the condition fails to improve as anticipated.   I provided 15+ minutes of time during this encounter.  Follow up plan: Return in about 6 months (around 06/30/2020) for Mood, Pain, GERD.

## 2020-01-01 NOTE — Assessment & Plan Note (Signed)
Chronic, stable.  Continue current medication regimen and adjust as needed.  Will obtain Mag level outpatient.

## 2020-01-01 NOTE — Assessment & Plan Note (Signed)
Chronic, ongoing.  Denis SI/HI.  Continue current medication regimen and adjust as needed.  Continues to take Clonopin but on minimal basis, last refill in June 2019.  Does not wish refill today, as states he has lots left.  Has been using less frequently.  Provided information on Buspar, which may be alternate option for future use.  Refills sent on Duloxetine.  Will obtain outpatient labs.  Return in 6 months.

## 2020-01-01 NOTE — Assessment & Plan Note (Signed)
Followed by pain clinic at [facility]. 

## 2020-01-01 NOTE — Patient Instructions (Signed)

## 2020-05-11 ENCOUNTER — Telehealth (INDEPENDENT_AMBULATORY_CARE_PROVIDER_SITE_OTHER): Payer: 59 | Admitting: Nurse Practitioner

## 2020-05-11 ENCOUNTER — Encounter: Payer: Self-pay | Admitting: Nurse Practitioner

## 2020-05-11 ENCOUNTER — Ambulatory Visit: Payer: Self-pay | Admitting: *Deleted

## 2020-05-11 ENCOUNTER — Other Ambulatory Visit: Payer: Self-pay

## 2020-05-11 DIAGNOSIS — U071 COVID-19: Secondary | ICD-10-CM

## 2020-05-11 DIAGNOSIS — J1282 Pneumonia due to coronavirus disease 2019: Secondary | ICD-10-CM | POA: Insufficient documentation

## 2020-05-11 MED ORDER — PREDNISONE 20 MG PO TABS: 40.0000 mg | ORAL_TABLET | Freq: Every day | ORAL | 0 refills | Status: AC

## 2020-05-11 MED ORDER — ALBUTEROL SULFATE HFA 108 (90 BASE) MCG/ACT IN AERS: 2.0000 | INHALATION_SPRAY | Freq: Four times a day (QID) | RESPIRATORY_TRACT | 0 refills | Status: DC | PRN

## 2020-05-11 MED ORDER — BENZONATATE 200 MG PO CAPS: 200.0000 mg | ORAL_CAPSULE | Freq: Two times a day (BID) | ORAL | 0 refills | Status: DC | PRN

## 2020-05-11 NOTE — Patient Instructions (Signed)
COVID-19 COVID-19 is a respiratory infection that is caused by a virus called severe acute respiratory syndrome coronavirus 2 (SARS-CoV-2). The disease is also known as coronavirus disease or novel coronavirus. In some people, the virus may not cause any symptoms. In others, it may cause a serious infection. The infection can get worse quickly and can lead to complications, such as:  Pneumonia, or infection of the lungs.  Acute respiratory distress syndrome or ARDS. This is a condition in which fluid build-up in the lungs prevents the lungs from filling with air and passing oxygen into the blood.  Acute respiratory failure. This is a condition in which there is not enough oxygen passing from the lungs to the body or when carbon dioxide is not passing from the lungs out of the body.  Sepsis or septic shock. This is a serious bodily reaction to an infection.  Blood clotting problems.  Secondary infections due to bacteria or fungus.  Organ failure. This is when your body's organs stop working. The virus that causes COVID-19 is contagious. This means that it can spread from person to person through droplets from coughs and sneezes (respiratory secretions). What are the causes? This illness is caused by a virus. You may catch the virus by:  Breathing in droplets from an infected person. Droplets can be spread by a person breathing, speaking, singing, coughing, or sneezing.  Touching something, like a table or a doorknob, that was exposed to the virus (contaminated) and then touching your mouth, nose, or eyes. What increases the risk? Risk for infection You are more likely to be infected with this virus if you:  Are within 6 feet (2 meters) of a person with COVID-19.  Provide care for or live with a person who is infected with COVID-19.  Spend time in crowded indoor spaces or live in shared housing. Risk for serious illness You are more likely to become seriously ill from the virus if you:   Are 50 years of age or older. The higher your age, the more you are at risk for serious illness.  Live in a nursing home or long-term care facility.  Have cancer.  Have a long-term (chronic) disease such as: ? Chronic lung disease, including chronic obstructive pulmonary disease or asthma. ? A long-term disease that lowers your body's ability to fight infection (immunocompromised). ? Heart disease, including heart failure, a condition in which the arteries that lead to the heart become narrow or blocked (coronary artery disease), a disease which makes the heart muscle thick, weak, or stiff (cardiomyopathy). ? Diabetes. ? Chronic kidney disease. ? Sickle cell disease, a condition in which red blood cells have an abnormal "sickle" shape. ? Liver disease.  Are obese. What are the signs or symptoms? Symptoms of this condition can range from mild to severe. Symptoms may appear any time from 2 to 14 days after being exposed to the virus. They include:  A fever or chills.  A cough.  Difficulty breathing.  Headaches, body aches, or muscle aches.  Runny or stuffy (congested) nose.  A sore throat.  New loss of taste or smell. Some people may also have stomach problems, such as nausea, vomiting, or diarrhea. Other people may not have any symptoms of COVID-19. How is this diagnosed? This condition may be diagnosed based on:  Your signs and symptoms, especially if: ? You live in an area with a COVID-19 outbreak. ? You recently traveled to or from an area where the virus is common. ? You   provide care for or live with a person who was diagnosed with COVID-19. ? You were exposed to a person who was diagnosed with COVID-19.  A physical exam.  Lab tests, which may include: ? Taking a sample of fluid from the back of your nose and throat (nasopharyngeal fluid), your nose, or your throat using a swab. ? A sample of mucus from your lungs (sputum). ? Blood tests.  Imaging tests, which  may include, X-rays, CT scan, or ultrasound. How is this treated? At present, there is no medicine to treat COVID-19. Medicines that treat other diseases are being used on a trial basis to see if they are effective against COVID-19. Your health care provider will talk with you about ways to treat your symptoms. For most people, the infection is mild and can be managed at home with rest, fluids, and over-the-counter medicines. Treatment for a serious infection usually takes places in a hospital intensive care unit (ICU). It may include one or more of the following treatments. These treatments are given until your symptoms improve.  Receiving fluids and medicines through an IV.  Supplemental oxygen. Extra oxygen is given through a tube in the nose, a face mask, or a hood.  Positioning you to lie on your stomach (prone position). This makes it easier for oxygen to get into the lungs.  Continuous positive airway pressure (CPAP) or bi-level positive airway pressure (BPAP) machine. This treatment uses mild air pressure to keep the airways open. A tube that is connected to a motor delivers oxygen to the body.  Ventilator. This treatment moves air into and out of the lungs by using a tube that is placed in your windpipe.  Tracheostomy. This is a procedure to create a hole in the neck so that a breathing tube can be inserted.  Extracorporeal membrane oxygenation (ECMO). This procedure gives the lungs a chance to recover by taking over the functions of the heart and lungs. It supplies oxygen to the body and removes carbon dioxide. Follow these instructions at home: Lifestyle  If you are sick, stay home except to get medical care. Your health care provider will tell you how long to stay home. Call your health care provider before you go for medical care.  Rest at home as told by your health care provider.  Do not use any products that contain nicotine or tobacco, such as cigarettes, e-cigarettes, and  chewing tobacco. If you need help quitting, ask your health care provider.  Return to your normal activities as told by your health care provider. Ask your health care provider what activities are safe for you. General instructions  Take over-the-counter and prescription medicines only as told by your health care provider.  Drink enough fluid to keep your urine pale yellow.  Keep all follow-up visits as told by your health care provider. This is important. How is this prevented?  There is no vaccine to help prevent COVID-19 infection. However, there are steps you can take to protect yourself and others from this virus. To protect yourself:   Do not travel to areas where COVID-19 is a risk. The areas where COVID-19 is reported change often. To identify high-risk areas and travel restrictions, check the CDC travel website: wwwnc.cdc.gov/travel/notices  If you live in, or must travel to, an area where COVID-19 is a risk, take precautions to avoid infection. ? Stay away from people who are sick. ? Wash your hands often with soap and water for 20 seconds. If soap and water   are not available, use an alcohol-based hand sanitizer. ? Avoid touching your mouth, face, eyes, or nose. ? Avoid going out in public, follow guidance from your state and local health authorities. ? If you must go out in public, wear a cloth face covering or face mask. Make sure your mask covers your nose and mouth. ? Avoid crowded indoor spaces. Stay at least 6 feet (2 meters) away from others. ? Disinfect objects and surfaces that are frequently touched every day. This may include:  Counters and tables.  Doorknobs and light switches.  Sinks and faucets.  Electronics, such as phones, remote controls, keyboards, computers, and tablets. To protect others: If you have symptoms of COVID-19, take steps to prevent the virus from spreading to others.  If you think you have a COVID-19 infection, contact your health care  provider right away. Tell your health care team that you think you may have a COVID-19 infection.  Stay home. Leave your house only to seek medical care. Do not use public transport.  Do not travel while you are sick.  Wash your hands often with soap and water for 20 seconds. If soap and water are not available, use alcohol-based hand sanitizer.  Stay away from other members of your household. Let healthy household members care for children and pets, if possible. If you have to care for children or pets, wash your hands often and wear a mask. If possible, stay in your own room, separate from others. Use a different bathroom.  Make sure that all people in your household wash their hands well and often.  Cough or sneeze into a tissue or your sleeve or elbow. Do not cough or sneeze into your hand or into the air.  Wear a cloth face covering or face mask. Make sure your mask covers your nose and mouth. Where to find more information  Centers for Disease Control and Prevention: www.cdc.gov/coronavirus/2019-ncov/index.html  World Health Organization: www.who.int/health-topics/coronavirus Contact a health care provider if:  You live in or have traveled to an area where COVID-19 is a risk and you have symptoms of the infection.  You have had contact with someone who has COVID-19 and you have symptoms of the infection. Get help right away if:  You have trouble breathing.  You have pain or pressure in your chest.  You have confusion.  You have bluish lips and fingernails.  You have difficulty waking from sleep.  You have symptoms that get worse. These symptoms may represent a serious problem that is an emergency. Do not wait to see if the symptoms will go away. Get medical help right away. Call your local emergency services (911 in the [place] Do not drive yourself to the hospital. Let the emergency medical personnel know if you think you have COVID-19. Summary  COVID-19 is a  respiratory infection that is caused by a virus. It is also known as coronavirus disease or novel coronavirus. It can cause serious infections, such as pneumonia, acute respiratory distress syndrome, acute respiratory failure, or sepsis.  The virus that causes COVID-19 is contagious. This means that it can spread from person to person through droplets from breathing, speaking, singing, coughing, or sneezing.  You are more likely to develop a serious illness if you are 50 years of age or older, have a weak immune system, live in a nursing home, or have chronic disease.  There is no medicine to treat COVID-19. Your health care provider will talk with you about ways to treat your symptoms.    Take steps to protect yourself and others from infection. Wash your hands often and disinfect objects and surfaces that are frequently touched every day. Stay away from people who are sick and wear a mask if you are sick. This information is not intended to replace advice given to you by your health care provider. Make sure you discuss any questions you have with your health care provider. Document Revised: 08/22/2019 Document Reviewed: 11/28/2018 [employer]vier Patient Education  2020 [employer]  

## 2020-05-11 NOTE — Progress Notes (Signed)
There were no vitals taken for this visit.   Subjective:    Patient ID: William Herring, male    DOB: 1971-02-17, 49 y.o.   MRN: [id number]  HPI: William Herring is a 49 y.o. male  Chief Complaint  Patient presents with  . Covid Exposure    pt states he tested positive for COVID last Wednesday at [facility] in [place]. States he started with a headache last Monday and has missed work since then.     . This visit was completed via telephone due to the restrictions of the COVID-19 pandemic. All issues as above were discussed and addressed but no physical exam was performed. If it was felt that the patient should be evaluated in the office, they were directed there. The patient verbally consented to this visit. Patient was unable to complete an audio/visual visit due to Technical difficulties,Lack of internet. Due to the catastrophic nature of the COVID-19 pandemic, this visit was done through audio contact only. . Location of the patient: home . Location of the provider: work . Those involved with this call:  . Provider: Marnee Guarneri, DNP . CMA: Yvonna Alanis, CMA . Front Desk/Registration: Don Perking  . Time spent on call: 20 minutes on the phone discussing health concerns. 15 minutes total spent in review of patient's record and preparation of their chart.  . I verified patient identity using two factors (patient name and date of birth). Patient consents verbally to being seen via telemedicine visit today.    COVID POSITIVE: Tested positive for Covid at [facility] in [place] last Wednesday, June 30th.  Symptoms started last Monday, June 28th.  Does state having loss of taste and smell.   Fever: no Cough: yes Shortness of breath: occasional  Wheezing: no Chest pain: no Chest tightness: yes Chest congestion: no Nasal congestion: no Runny nose: yes Post nasal drip: yes Sneezing: no Sore throat: yes Swollen glands: no Sinus pressure: no Headache: yes -- this is  improving Face pain: no Toothache: no Ear pain: none Ear pressure: none Eyes red/itching:no Eye drainage/crusting: no  Vomiting: no Rash: no Fatigue: yes Sick contacts: yes Strep contacts: no  Context: stable Recurrent sinusitis: no Relief with OTC cold/cough medications: has not taken anything Treatments attempted: Tylenol  Relevant past medical, surgical, family and social history reviewed and updated as indicated. Interim medical history since our last visit reviewed. Allergies and medications reviewed and updated.  Review of Systems  Constitutional: Positive for fatigue. Negative for activity change, appetite change, chills and fever.  HENT: Positive for postnasal drip, rhinorrhea, sinus pressure and sore throat. Negative for congestion, ear discharge, ear pain, sinus pain, tinnitus and voice change.   Respiratory: Positive for cough and shortness of breath. Negative for choking and wheezing.   Cardiovascular: Negative for chest pain, palpitations and leg swelling.  Gastrointestinal: Negative.   Neurological: Negative.   Psychiatric/Behavioral: Negative.     Per HPI unless specifically indicated above     Objective:    There were no vitals taken for this visit.  Wt Readings from Last 3 Encounters:  09/16/18 186 lb (84.4 kg)  06/17/18 187 lb 12.8 oz (85.2 kg)  04/16/18 185 lb 8 oz (84.1 kg)    Physical Exam   Unable to perform due to telephone visit only.  Results for orders placed or performed in visit on 12/04/17  CBC With Differential/Platelet  Result Value Ref Range   WBC 6.6 3.4 - 10.8 x10E3/uL   RBC 4.95 4.14 -  5.80 x10E6/uL   Hemoglobin 15.6 13.0 - 17.7 g/dL   Hematocrit 42.7 37.5 - 51.0 %   MCV 86 79 - 97 fL   MCH 31.5 26.6 - 33.0 pg   MCHC 36.5 (H) 31 - 35 g/dL   RDW 12.8 12.3 - 15.4 %   Platelets 206 150 - 379 x10E3/uL   Neutrophils 53 Not Estab. %   Lymphs 39 Not Estab. %   MID 8 Not Estab. %   Neutrophils Absolute 3.5 1 - 7 x10E3/uL    Lymphocytes Absolute 2.6 0 - 3 x10E3/uL   MID (Absolute) 0.5 0.1 - 1.6 X10E3/uL  UA/M w/rflx Culture, Routine   BLD  Result Value Ref Range   Specific Gravity, UA 1.020 1.005 - 1.030   pH, UA 6.0 5.0 - 7.5   Color, UA Yellow Yellow   Appearance Ur Clear Clear   Leukocytes, UA Negative Negative   Protein, UA Negative Negative/Trace   Glucose, UA Negative Negative   Ketones, UA Negative Negative   RBC, UA Negative Negative   Bilirubin, UA Negative Negative   Urobilinogen, Ur 0.2 0.2 - 1.0 mg/dL   Nitrite, UA Negative Negative      Assessment & Plan:   Problem List Items Addressed This Visit      Other   Lab test positive for detection of COVID-19 virus    Tested positive on June 30th.  Recommend a full 10 days quarantine and continue quarantine past this if symptoms are not improved.  Scripts for [employer], Albuterol, and Prednisone sent in.  Recommend increase rest and hydration at home.  If any worsening SOB or any CP presents advised him to go immediately to ER setting.  Will plan to follow-up with him in one week.  Work note provided.         I discussed the assessment and treatment plan with the patient. The patient was provided an opportunity to ask questions and all were answered. The patient agreed with the plan and demonstrated an understanding of the instructions.   The patient was advised to call back or seek an in-person evaluation if the symptoms worsen or if the condition fails to improve as anticipated.   I provided 21+ minutes of time during this encounter.  Follow up plan: Return in about 1 week (around 05/18/2020) for Covid.

## 2020-05-11 NOTE — Telephone Encounter (Signed)
Summary: covid +, congestion, headache   Patient requesting call back from RN to discuss positive covid infection. Patient would like to discuss OTC medications that can help with headache, cough and fatigue.      Call to patient- patient is concerned that he has been ill for about 10 days now- and is missing work. Reviewed COVID symptoms and treatment for those symptoms, advised hydration and eating to help with fatigue. Patient does have persistent dry cough- virtual appointment scheduled with PCP for supportive help with symptoms.   Reason for Disposition . [1] PERSISTING SYMPTOMS OF COVID-19 AND [2] symptoms BETTER (improving)  Answer Assessment - Initial Assessment Questions 1. COVID-19 ONSET: "When did the symptoms of COVID-19 first start?"     10 days ago 2. DIAGNOSIS CONFIRMATION: "How were you diagnosed?" (e.g., COVID-19 oral or nasal viral test; COVID-19 antibody test; doctor visit)     Wed/Thur- + COVID 3. MAIN SYMPTOM:  "What is your main concern or symptom right now?" (e.g., breathing difficulty, cough, fatigue. loss of smell)     Weakness, cough 4. SYMPTOM ONSET: "When did the  cough  start?"     10 days 5. BETTER-SAME-WORSE: "Are you getting better, staying the same, or getting worse over the last 1 to 2 weeks?"     Little better 6. RECENT MEDICAL VISIT: "Have you been seen by a healthcare provider (doctor, NP, PA) for these persisting COVID-19 symptoms?" If Yes, ask: "When were you seen?" (e.g., date)     No visit 7. COUGH: "Do you have a cough?" If Yes, ask: "How bad is the cough?"       Yes- coughs when talks 8. FEVER: "Do you have a fever?" If Yes, ask: "What is your temperature, how was it measured, and when did it start?"     no 9. BREATHING DIFFICULTY: "Are you having any trouble breathing?" If Yes, ask: "How bad is your breathing?" (e.g., mild, moderate, severe)    - MILD: No SOB at rest, mild SOB with walking, speaks normally in sentences, can lay down, no  retractions, pulse < 100.    - MODERATE: SOB at rest, SOB with minimal exertion and prefers to sit, cannot lie down flat, speaks in phrases, mild retractions, audible wheezing, pulse 100-120.    - SEVERE: Very SOB at rest, speaks in single words, struggling to breathe, sitting hunched forward, retractions, pulse > 120       mild 10. HIGH RISK DISEASE: "Do you have any chronic medical problems?" (e.g., asthma, heart or lung disease, weak immune system, obesity, etc.)       no 11. PREGNANCY: "Is there any chance you are pregnant?" "When was your last menstrual period?"       n/a 12. OTHER SYMPTOMS: "Do you have any other symptoms?"  (e.g., fatigue, headache, muscle pain, weakness)       fatigue  Protocols used: CORONAVIRUS (COVID-19) PERSISTING SYMPTOMS FOLLOW-UP CALL-A-AH

## 2020-05-11 NOTE — Telephone Encounter (Signed)
Noted  

## 2020-05-11 NOTE — Assessment & Plan Note (Signed)
Tested positive on June 30th.  Recommend a full 10 days quarantine and continue quarantine past this if symptoms are not improved.  Scripts for [employer], Albuterol, and Prednisone sent in.  Recommend increase rest and hydration at home.  If any worsening SOB or any CP presents advised him to go immediately to ER setting.  Will plan to follow-up with him in one week.  Work note provided.

## 2020-05-13 ENCOUNTER — Other Ambulatory Visit: Payer: Self-pay

## 2020-05-13 ENCOUNTER — Emergency Department
Admission: EM | Admit: 2020-05-13 | Discharge: 2020-05-13 | Disposition: A | Discharge status: Home / Self Care | Payer: 59 | Attending: Emergency Medicine | Admitting: Emergency Medicine

## 2020-05-13 ENCOUNTER — Emergency Department: Payer: 59

## 2020-05-13 ENCOUNTER — Encounter: Payer: Self-pay | Admitting: Emergency Medicine

## 2020-05-13 DIAGNOSIS — U071 COVID-19: Secondary | ICD-10-CM | POA: Diagnosis not present

## 2020-05-13 DIAGNOSIS — R072 Precordial pain: Secondary | ICD-10-CM | POA: Diagnosis not present

## 2020-05-13 DIAGNOSIS — Z87891 Personal history of nicotine dependence: Secondary | ICD-10-CM | POA: Insufficient documentation

## 2020-05-13 DIAGNOSIS — Z91040 Latex allergy status: Secondary | ICD-10-CM | POA: Diagnosis not present

## 2020-05-13 LAB — COMPREHENSIVE METABOLIC PANEL
ALT: 43 U/L (ref 0–44)
AST: 75 U/L — ABNORMAL HIGH (ref 15–41)
Albumin: 4.4 g/dL (ref 3.5–5.0)
Alkaline Phosphatase: 77 U/L (ref 38–126)
Anion gap: 13 (ref 5–15)
BUN: 12 mg/dL (ref 6–20)
CO2: 30 mmol/L (ref 22–32)
Calcium: 9.5 mg/dL (ref 8.9–10.3)
Chloride: 96 mmol/L — ABNORMAL LOW (ref 98–111)
Creatinine, Ser: 0.74 mg/dL (ref 0.61–1.24)
GFR calc Af Amer: >60 mL/min (ref >60)
GFR calc non Af Amer: >60 mL/min (ref >60)
Glucose, Bld: 144 mg/dL — ABNORMAL HIGH (ref 70–99)
Potassium: 3.6 mmol/L (ref 3.5–5.1)
Sodium: 139 mmol/L (ref 135–145)
Total Bilirubin: 0.9 mg/dL (ref 0.3–1.2)
Total Protein: 8.4 g/dL — ABNORMAL HIGH (ref 6.5–8.1)

## 2020-05-13 LAB — URINALYSIS, COMPLETE (UACMP) WITH MICROSCOPIC
Bacteria, UA: NONE SEEN
Bilirubin Urine: NEGATIVE
Glucose, UA: NEGATIVE mg/dL
Hgb urine dipstick: NEGATIVE
Ketones, ur: 20 mg/dL — AB
Leukocytes,Ua: NEGATIVE
Nitrite: NEGATIVE
Protein, ur: NEGATIVE mg/dL
Specific Gravity, Urine: 1.01 (ref 1.005–1.030)
Squamous Epithelial / HPF: NONE SEEN (ref 0–5)
pH: 7 (ref 5.0–8.0)

## 2020-05-13 LAB — CBC WITH DIFFERENTIAL/PLATELET
Abs Immature Granulocytes: 0.04 10*3/uL (ref 0.00–0.07)
Basophils Absolute: 0 10*3/uL (ref 0.0–0.1)
Basophils Relative: 1 %
Eosinophils Absolute: 0 10*3/uL (ref 0.0–0.5)
Eosinophils Relative: 0 %
HCT: 41.8 % (ref 39.0–52.0)
Hemoglobin: 15.5 g/dL (ref 13.0–17.0)
Immature Granulocytes: 1 %
Lymphocytes Relative: 22 %
Lymphs Abs: 0.8 10*3/uL (ref 0.7–4.0)
MCH: 30.3 pg (ref 26.0–34.0)
MCHC: 37.1 g/dL — ABNORMAL HIGH (ref 30.0–36.0)
MCV: 81.8 fL (ref 80.0–100.0)
Monocytes Absolute: 0.2 10*3/uL (ref 0.1–1.0)
Monocytes Relative: 6 %
Neutro Abs: 2.7 10*3/uL (ref 1.7–7.7)
Neutrophils Relative %: 70 %
Platelets: 222 10*3/uL (ref 150–400)
RBC: 5.11 MIL/uL (ref 4.22–5.81)
RDW: 12 % (ref 11.5–15.5)
WBC: 3.7 10*3/uL — ABNORMAL LOW (ref 4.0–10.5)
nRBC: 0 % (ref 0.0–0.2)

## 2020-05-13 LAB — FIBRIN DERIVATIVES D-DIMER (ARMC ONLY): Fibrin derivatives D-dimer (ARMC): 1864.6 ng/mL (FEU) — ABNORMAL HIGH (ref 0.00–499.00)

## 2020-05-13 LAB — TROPONIN I (HIGH SENSITIVITY)
Troponin I (High Sensitivity): 7 ng/L (ref <18)
Troponin I (High Sensitivity): 7 ng/L (ref <18)

## 2020-05-13 MED ORDER — AZITHROMYCIN 250 MG PO TABS
ORAL_TABLET | ORAL | 0 refills | Status: DC
Start: 2020-05-13 — End: 2020-05-18

## 2020-05-13 MED ORDER — AZITHROMYCIN 250 MG PO TABS
ORAL_TABLET | ORAL | 0 refills | Status: DC
Start: 2020-05-13 — End: 2020-05-13

## 2020-05-13 MED ORDER — SODIUM CHLORIDE 0.9 % IV BOLUS
1000.0000 mL | Freq: Once | INTRAVENOUS | Status: AC
  Administered 2020-05-13: 1000 mL via INTRAVENOUS

## 2020-05-13 MED ORDER — IOHEXOL 350 MG/ML SOLN
75.0000 mL | Freq: Once | INTRAVENOUS | Status: AC | PRN
  Administered 2020-05-13: 75 mL via INTRAVENOUS
  Filled 2020-05-13: qty 75

## 2020-05-13 NOTE — Discharge Instructions (Signed)
Follow-up with your regular doctor if not improving in 2 to 3 days.  Return emergency department worsening.

## 2020-05-13 NOTE — ED Notes (Signed)
Pt aware of need for urine sample.  

## 2020-05-13 NOTE — ED Notes (Signed)
E-signature not working at this time. Pt verbalized understanding of D/C instructions, prescriptions and follow up care with no further questions at this time. Pt in NAD and ambulatory at time of D/C.  

## 2020-05-13 NOTE — ED Triage Notes (Signed)
Patient reports he was diagnosed with COVID last week. States he is not feeling any better and is feeling tired and decreased appetite. Hot and cold spells.

## 2020-05-13 NOTE — ED Provider Notes (Signed)
[facility] Emergency Department Provider Note  ____________________________________________   First MD Initiated Contact with Patient 05/13/20 1625     (approximate)  I have reviewed the triage vital signs and the nursing notes.   HISTORY  Chief Complaint COVID    HPI William Herring is a 49 y.o. male presents emergency department with worsening Covid.  States he has been sick for approximately 10 days.  He talked with his family doctor today who called in prednisone, Tessalon Perles and some other medication.  States he continues to feel really tired with a decreased appetite.  Has not eaten in several days.  Hot and cold spells.  Some pain in the chest along the sternum.   Denies shortness of breath.  No vomiting or diarrhea.   History reviewed. No pertinent past medical history.  Patient Active Problem List   Diagnosis Date Noted  . Lab test positive for detection of COVID-19 virus 05/11/2020  . Depression, major, single episode, severe ([place]) 08/28/2017  . GERD (gastroesophageal reflux disease) 08/28/2017  . Chronic, continuous use of opioids 02/02/2017  . Intervertebral disc disorder with radiculopathy of lumbar region 06/24/2015  . Chronic pain syndrome 04/21/2015  . Anxiety 04/21/2015  . 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 Lumbar radiculopathy 08/27/2012    Past Surgical History:  Procedure Laterality Date  . HEMILAMINOTOMY LUMBAR SPINE     pt states he had this 5 to 6 years ago   . HERNIA REPAIR  inguinal  . LUMBAR DISC SURGERY     pt states he had surgery 5 or 6 years ago     Prior to Admission medications   Medication Sig Start Date End Date Taking? Authorizing Provider  albuterol (VENTOLIN HFA) 108 (90 Base) MCG/ACT inhaler Inhale 2 puffs into the lungs every 6 (six) hours as needed for wheezing or shortness of breath. 05/11/20   Cannady, Henrine Screws T, NP  azithromycin (ZITHROMAX Z-PAK) 250 MG tablet 2 pills today  then 1 pill a day for 4 days 05/13/20   Caryn Section Linden Dolin, PA-C  benzonatate (TESSALON) 200 MG capsule Take 1 capsule (200 mg total) by mouth 2 (two) times daily as needed for cough. 05/11/20   Cannady, Henrine Screws T, NP  clonazePAM (KLONOPIN) 0.5 MG tablet Take 1 tablet (0.5 mg total) by mouth daily. 04/16/18   Kathrine Haddock, NP  DULoxetine (CYMBALTA) 60 MG capsule TAKE 1 CAPSULE BY MOUTH EVERY DAY. 01/01/20   Cannady, Henrine Screws T, NP  gabapentin (NEURONTIN) 600 MG tablet TAKE 1 TABLET BY MOUTH 3 TIMES DAILY 02/18/18   Kathrine Haddock, NP  HYDROcodone-acetaminophen (NORCO) 7.5-325 MG tablet Take 1 tablet by mouth every 8 (eight) hours as needed. 01/13/20   [provider]  omeprazole (PRILOSEC) 20 MG capsule TAKE 1 CAPSULE BY MOUTH EVERY DAY 08/21/19   Kathrine Haddock, NP  predniSONE (DELTASONE) 20 MG tablet Take 2 tablets (40 mg total) by mouth daily with breakfast for 5 days. 05/11/20 05/16/20  Venita Lick, NP    Allergies Latex  Family History  Problem Relation Age of Onset  . Hypertension Mother   . Mental illness Mother   . Stroke Paternal Grandmother   . Asthma Father     Social History Social History   Tobacco Use  . Smoking status: Former Research scientist (life sciences)  . Smokeless tobacco: Former Systems developer    Types: Secondary school teacher  . Vaping Use: Never used  Substance Use Topics  . Alcohol use: Yes  Alcohol/week: 0.0 standard drinks    Comment: on occasion  . Drug use: No    Review of Systems  Constitutional: Positive fever/chills Eyes: No visual changes. ENT: No sore throat. Respiratory: Positive cough Cardiovascular: Positive chest pain Gastrointestinal: Denies abdominal pain Genitourinary: Negative for dysuria. Musculoskeletal: Negative for back pain. Skin: Negative for rash. Psychiatric: no mood changes,     ____________________________________________   PHYSICAL EXAM:  VITAL SIGNS: ED Triage Vitals  Enc Vitals Group     BP 05/13/20 1441 113/82     Pulse Rate 05/13/20 1441 91      Resp 05/13/20 1441 16     Temp 05/13/20 1441 98.1 F (36.7 C)     Temp Source 05/13/20 1441 Oral     SpO2 05/13/20 1441 96 %     Weight 05/13/20 1442 185 lb 3 oz (84 kg)     Height 05/13/20 1442 5\' 8"  (1.727 m)     Head Circumference --      Peak Flow --      Pain Score 05/13/20 1441 0     Pain Loc --      Pain Edu? --      Excl. in [place]? --     Constitutional: Alert and oriented. Well appearing and in no acute distress. Eyes: Conjunctivae are normal.  Head: Atraumatic. Nose: No congestion/rhinnorhea. Mouth/Throat: Mucous membranes are moist.   Neck:  supple no lymphadenopathy noted Cardiovascular: Normal rate, regular rhythm. Heart sounds are normal Respiratory: Normal respiratory effort.  No retractions, lungs c t a  Abd: soft nontender bs normal all 4 quad GU: deferred Musculoskeletal: FROM all extremities, warm and well perfused Neurologic:  Normal speech and language.  Skin:  Skin is warm, dry and intact. No rash noted. Psychiatric: Mood and affect are normal. Speech and behavior are normal.  ____________________________________________   LABS (all labs ordered are listed, but only abnormal results are displayed)  Labs Reviewed  COMPREHENSIVE METABOLIC PANEL - Abnormal; Notable for the following components:      Result Value   Chloride 96 (*)    Glucose, Bld 144 (*)    Total Protein 8.4 (*)    AST 75 (*)    All other components within normal limits  CBC WITH DIFFERENTIAL/PLATELET - Abnormal; Notable for the following components:   WBC 3.7 (*)    MCHC 37.1 (*)    All other components within normal limits  FIBRIN DERIVATIVES D-DIMER ([facility] ONLY) - Abnormal; Notable for the following components:   Fibrin derivatives D-dimer ([facility]) 1,864.60 (*)    All other components within normal limits  URINALYSIS, COMPLETE (UACMP) WITH MICROSCOPIC  TROPONIN I (HIGH SENSITIVITY)  TROPONIN I (HIGH SENSITIVITY)    ____________________________________________   ____________________________________________  RADIOLOGY  Chest x-ray shows covid pneumonia CT for PE is negative  ____________________________________________   PROCEDURES  Procedure(s) performed: EKG  Nsr, see physician read  Procedures    ____________________________________________   INITIAL IMPRESSION / ASSESSMENT AND PLAN / ED COURSE  Pertinent labs & imaging results that were available during my care of the patient were reviewed by me and considered in my medical decision making (see chart for details).   Patient is 49 year old male presents emergency department worsening Covid symptoms.  See HPI  Physical exam shows patient to appear very tired.  Vitals normal with O2 saturation of 96% on room air.  Patient is not tachycardic.  Lungs are clear to auscultation.  Abdomen is nontender.  Remainder exams are unremarkable  DDx:  Covid, pneumonia, weakness associated with viral infection, PE, MI  CBC, compressive metabolic panel, D-dimer, troponin, UA, chest x-ray, EKG  CBC has a decreased WBC of 3.7, comprehensive metabolic panel has increased glucose of 144, increased AST of 75, troponin is normal, fibrin derivative is increased more than 3 times this norm.  1864.6, therefore I feel that we should order a CT for PE as he has had some midsternal chest pain.   CT for PE is negative.  Patient be treated for his Covid pneumonia.  We will add a Z-Pak to the regimen.  He is to drink fluids.  Return if worsening.  Is also given additional time off of work.  As part of my medical decision making, I reviewed the following data within the [place] notes reviewed and incorporated, Labs reviewed , EKG interpreted NSR, Old chart reviewed, Radiograph reviewed , Notes from prior ED visits and [place] Controlled Substance Database  ____________________________________________   FINAL CLINICAL IMPRESSION(S) / ED  DIAGNOSES  Final diagnoses:  COVID-19      NEW MEDICATIONS STARTED DURING THIS VISIT:  New Prescriptions   AZITHROMYCIN (ZITHROMAX Z-PAK) 250 MG TABLET    2 pills today then 1 pill a day for 4 days     Note:  This document was prepared using Dragon voice recognition software and may include unintentional dictation errors.    Versie Starks, PA-C 05/13/20 1914    Carrie Mew, MD 05/13/20 2053

## 2020-05-13 NOTE — ED Notes (Signed)
Pt transported for CT 

## 2020-05-18 ENCOUNTER — Telehealth (INDEPENDENT_AMBULATORY_CARE_PROVIDER_SITE_OTHER): Payer: 59 | Admitting: Nurse Practitioner

## 2020-05-18 ENCOUNTER — Encounter: Payer: Self-pay | Admitting: Nurse Practitioner

## 2020-05-18 ENCOUNTER — Other Ambulatory Visit: Payer: Self-pay

## 2020-05-18 DIAGNOSIS — U071 COVID-19: Secondary | ICD-10-CM

## 2020-05-18 DIAGNOSIS — J1282 Pneumonia due to coronavirus disease 2019: Secondary | ICD-10-CM | POA: Diagnosis not present

## 2020-05-18 MED ORDER — BENZONATATE 200 MG PO CAPS: 200.0000 mg | ORAL_CAPSULE | Freq: Two times a day (BID) | ORAL | 0 refills | Status: DC | PRN

## 2020-05-18 NOTE — Patient Instructions (Signed)
COVID-19 COVID-19 is a respiratory infection that is caused by a virus called severe acute respiratory syndrome coronavirus 2 (SARS-CoV-2). The disease is also known as coronavirus disease or novel coronavirus. In some people, the virus may not cause any symptoms. In others, it may cause a serious infection. The infection can get worse quickly and can lead to complications, such as:  Pneumonia, or infection of the lungs.  Acute respiratory distress syndrome or ARDS. This is a condition in which fluid build-up in the lungs prevents the lungs from filling with air and passing oxygen into the blood.  Acute respiratory failure. This is a condition in which there is not enough oxygen passing from the lungs to the body or when carbon dioxide is not passing from the lungs out of the body.  Sepsis or septic shock. This is a serious bodily reaction to an infection.  Blood clotting problems.  Secondary infections due to bacteria or fungus.  Organ failure. This is when your body's organs stop working. The virus that causes COVID-19 is contagious. This means that it can spread from person to person through droplets from coughs and sneezes (respiratory secretions). What are the causes? This illness is caused by a virus. You may catch the virus by:  Breathing in droplets from an infected person. Droplets can be spread by a person breathing, speaking, singing, coughing, or sneezing.  Touching something, like a table or a doorknob, that was exposed to the virus (contaminated) and then touching your mouth, nose, or eyes. What increases the risk? Risk for infection You are more likely to be infected with this virus if you:  Are within 6 feet (2 meters) of a person with COVID-19.  Provide care for or live with a person who is infected with COVID-19.  Spend time in crowded indoor spaces or live in shared housing. Risk for serious illness You are more likely to become seriously ill from the virus if you:   Are 50 years of age or older. The higher your age, the more you are at risk for serious illness.  Live in a nursing home or long-term care facility.  Have cancer.  Have a long-term (chronic) disease such as: ? Chronic lung disease, including chronic obstructive pulmonary disease or asthma. ? A long-term disease that lowers your body's ability to fight infection (immunocompromised). ? Heart disease, including heart failure, a condition in which the arteries that lead to the heart become narrow or blocked (coronary artery disease), a disease which makes the heart muscle thick, weak, or stiff (cardiomyopathy). ? Diabetes. ? Chronic kidney disease. ? Sickle cell disease, a condition in which red blood cells have an abnormal "sickle" shape. ? Liver disease.  Are obese. What are the signs or symptoms? Symptoms of this condition can range from mild to severe. Symptoms may appear any time from 2 to 14 days after being exposed to the virus. They include:  A fever or chills.  A cough.  Difficulty breathing.  Headaches, body aches, or muscle aches.  Runny or stuffy (congested) nose.  A sore throat.  New loss of taste or smell. Some people may also have stomach problems, such as nausea, vomiting, or diarrhea. Other people may not have any symptoms of COVID-19. How is this diagnosed? This condition may be diagnosed based on:  Your signs and symptoms, especially if: ? You live in an area with a COVID-19 outbreak. ? You recently traveled to or from an area where the virus is common. ? You   provide care for or live with a person who was diagnosed with COVID-19. ? You were exposed to a person who was diagnosed with COVID-19.  A physical exam.  Lab tests, which may include: ? Taking a sample of fluid from the back of your nose and throat (nasopharyngeal fluid), your nose, or your throat using a swab. ? A sample of mucus from your lungs (sputum). ? Blood tests.  Imaging tests, which  may include, X-rays, CT scan, or ultrasound. How is this treated? At present, there is no medicine to treat COVID-19. Medicines that treat other diseases are being used on a trial basis to see if they are effective against COVID-19. Your health care provider will talk with you about ways to treat your symptoms. For most people, the infection is mild and can be managed at home with rest, fluids, and over-the-counter medicines. Treatment for a serious infection usually takes places in a hospital intensive care unit (ICU). It may include one or more of the following treatments. These treatments are given until your symptoms improve.  Receiving fluids and medicines through an IV.  Supplemental oxygen. Extra oxygen is given through a tube in the nose, a face mask, or a hood.  Positioning you to lie on your stomach (prone position). This makes it easier for oxygen to get into the lungs.  Continuous positive airway pressure (CPAP) or bi-level positive airway pressure (BPAP) machine. This treatment uses mild air pressure to keep the airways open. A tube that is connected to a motor delivers oxygen to the body.  Ventilator. This treatment moves air into and out of the lungs by using a tube that is placed in your windpipe.  Tracheostomy. This is a procedure to create a hole in the neck so that a breathing tube can be inserted.  Extracorporeal membrane oxygenation (ECMO). This procedure gives the lungs a chance to recover by taking over the functions of the heart and lungs. It supplies oxygen to the body and removes carbon dioxide. Follow these instructions at home: Lifestyle  If you are sick, stay home except to get medical care. Your health care provider will tell you how long to stay home. Call your health care provider before you go for medical care.  Rest at home as told by your health care provider.  Do not use any products that contain nicotine or tobacco, such as cigarettes, e-cigarettes, and  chewing tobacco. If you need help quitting, ask your health care provider.  Return to your normal activities as told by your health care provider. Ask your health care provider what activities are safe for you. General instructions  Take over-the-counter and prescription medicines only as told by your health care provider.  Drink enough fluid to keep your urine pale yellow.  Keep all follow-up visits as told by your health care provider. This is important. How is this prevented?  There is no vaccine to help prevent COVID-19 infection. However, there are steps you can take to protect yourself and others from this virus. To protect yourself:   Do not travel to areas where COVID-19 is a risk. The areas where COVID-19 is reported change often. To identify high-risk areas and travel restrictions, check the CDC travel website: wwwnc.cdc.gov/travel/notices  If you live in, or must travel to, an area where COVID-19 is a risk, take precautions to avoid infection. ? Stay away from people who are sick. ? Wash your hands often with soap and water for 20 seconds. If soap and water   are not available, use an alcohol-based hand sanitizer. ? Avoid touching your mouth, face, eyes, or nose. ? Avoid going out in public, follow guidance from your state and local health authorities. ? If you must go out in public, wear a cloth face covering or face mask. Make sure your mask covers your nose and mouth. ? Avoid crowded indoor spaces. Stay at least 6 feet (2 meters) away from others. ? Disinfect objects and surfaces that are frequently touched every day. This may include:  Counters and tables.  Doorknobs and light switches.  Sinks and faucets.  Electronics, such as phones, remote controls, keyboards, computers, and tablets. To protect others: If you have symptoms of COVID-19, take steps to prevent the virus from spreading to others.  If you think you have a COVID-19 infection, contact your health care  provider right away. Tell your health care team that you think you may have a COVID-19 infection.  Stay home. Leave your house only to seek medical care. Do not use public transport.  Do not travel while you are sick.  Wash your hands often with soap and water for 20 seconds. If soap and water are not available, use alcohol-based hand sanitizer.  Stay away from other members of your household. Let healthy household members care for children and pets, if possible. If you have to care for children or pets, wash your hands often and wear a mask. If possible, stay in your own room, separate from others. Use a different bathroom.  Make sure that all people in your household wash their hands well and often.  Cough or sneeze into a tissue or your sleeve or elbow. Do not cough or sneeze into your hand or into the air.  Wear a cloth face covering or face mask. Make sure your mask covers your nose and mouth. Where to find more information  Centers for Disease Control and Prevention: www.cdc.gov/coronavirus/2019-ncov/index.html  World Health Organization: www.who.int/health-topics/coronavirus Contact a health care provider if:  You live in or have traveled to an area where COVID-19 is a risk and you have symptoms of the infection.  You have had contact with someone who has COVID-19 and you have symptoms of the infection. Get help right away if:  You have trouble breathing.  You have pain or pressure in your chest.  You have confusion.  You have bluish lips and fingernails.  You have difficulty waking from sleep.  You have symptoms that get worse. These symptoms may represent a serious problem that is an emergency. Do not wait to see if the symptoms will go away. Get medical help right away. Call your local emergency services (911 in the [place] Do not drive yourself to the hospital. Let the emergency medical personnel know if you think you have COVID-19. Summary  COVID-19 is a  respiratory infection that is caused by a virus. It is also known as coronavirus disease or novel coronavirus. It can cause serious infections, such as pneumonia, acute respiratory distress syndrome, acute respiratory failure, or sepsis.  The virus that causes COVID-19 is contagious. This means that it can spread from person to person through droplets from breathing, speaking, singing, coughing, or sneezing.  You are more likely to develop a serious illness if you are 50 years of age or older, have a weak immune system, live in a nursing home, or have chronic disease.  There is no medicine to treat COVID-19. Your health care provider will talk with you about ways to treat your symptoms.    Take steps to protect yourself and others from infection. Wash your hands often and disinfect objects and surfaces that are frequently touched every day. Stay away from people who are sick and wear a mask if you are sick. This information is not intended to replace advice given to you by your health care provider. Make sure you discuss any questions you have with your health care provider. Document Revised: 08/22/2019 Document Reviewed: 11/28/2018 [employer]vier Patient Education  2020 [employer]  

## 2020-05-18 NOTE — Progress Notes (Signed)
There were no vitals taken for this visit.   Subjective:    Patient ID: William Herring, male    DOB: 1971/09/17, 49 y.o.   MRN: [id number]  HPI: William Herring is a 49 y.o. male  Chief Complaint  Patient presents with  . Covid Exposure    pt states he is feeling a little bit better    . This visit was completed via telephone due to the restrictions of the COVID-19 pandemic. All issues as above were discussed and addressed but no physical exam was performed. If it was felt that the patient should be evaluated in the office, they were directed there. The patient verbally consented to this visit. Patient was unable to complete an audio/visual visit due to Lack of equipment. Due to the catastrophic nature of the COVID-19 pandemic, this visit was done through audio contact only. . Location of the patient: home . Location of the provider: work . Those involved with this call:  . Provider: Marnee Guarneri, DNP . CMA: Yvonna Alanis, CMA . Front Desk/Registration: Don Perking  . Time spent on call: 20 minutes on the phone discussing health concerns. 15 minutes total spent in review of patient's record and preparation of their chart.  . I verified patient identity using two factors (patient name and date of birth). Patient consents verbally to being seen via telemedicine visit today.    COVID POSITIVE: Tested positive for Covid at [facility] in [place] last Wednesday, June 30th.  Symptoms started last Monday, June 28th.  Does state having loss of taste and smell.  Was seen in ER on 05/13/20 -- diagnosed with Covid PNA (noted on CT and CXR) -- started on Azithromycin.  Reports was feeling a bit better yesterday and this morning, but now feel tired again.  Overall reports beginning to feel better though, slowly. Fever: no Cough: yes -- is improving with inhaler and Tessalon Shortness of breath: improving Wheezing: no Chest pain: no Chest tightness: yes Chest congestion: no Nasal  congestion: no Runny nose: yes Post nasal drip: yes Sneezing: no Sore throat: no Swollen glands: no Sinus pressure: no Headache: on and off, not as bad Face pain: no Toothache: no Ear pain: none Ear pressure: none Eyes red/itching:no Eye drainage/crusting: no  Vomiting: no Rash: no Fatigue: yes Sick contacts: yes Strep contacts: no  Context: stable Recurrent sinusitis: no Relief with OTC cold/cough medications: has not taken anything Treatments attempted: Tylenol  Relevant past medical, surgical, family and social history reviewed and updated as indicated. Interim medical history since our last visit reviewed. Allergies and medications reviewed and updated.  Review of Systems  Constitutional: Positive for fatigue. Negative for activity change, appetite change, chills and fever.  HENT: Negative for congestion, ear discharge, ear pain, postnasal drip, rhinorrhea, sinus pressure, sinus pain, sore throat, tinnitus and voice change.   Respiratory: Positive for cough. Negative for choking, shortness of breath and wheezing.   Cardiovascular: Negative for chest pain, palpitations and leg swelling.  Gastrointestinal: Negative.   Neurological: Positive for headaches.  Psychiatric/Behavioral: Negative.     Per HPI unless specifically indicated above     Objective:    There were no vitals taken for this visit.  Wt Readings from Last 3 Encounters:  05/13/20 185 lb 3 oz (84 kg)  09/16/18 186 lb (84.4 kg)  06/17/18 187 lb 12.8 oz (85.2 kg)    Physical Exam   Unable to perform due to telephone visit only.  Results for orders placed or  performed during the hospital encounter of 05/13/20  Comprehensive metabolic panel  Result Value Ref Range   Sodium 139 135 - 145 mmol/L   Potassium 3.6 3.5 - 5.1 mmol/L   Chloride 96 (L) 98 - 111 mmol/L   CO2 30 22 - 32 mmol/L   Glucose, Bld 144 (H) 70 - 99 mg/dL   BUN 12 6 - 20 mg/dL   Creatinine, Ser 0.74 0.61 - 1.24 mg/dL   Calcium 9.5  8.9 - 10.3 mg/dL   Total Protein 8.4 (H) 6.5 - 8.1 g/dL   Albumin 4.4 3.5 - 5.0 g/dL   AST 75 (H) 15 - 41 U/L   ALT 43 0 - 44 U/L   Alkaline Phosphatase 77 38 - 126 U/L   Total Bilirubin 0.9 0.3 - 1.2 mg/dL   GFR calc non Af Amer >60 >60 mL/min   GFR calc Af Amer >60 >60 mL/min   Anion gap 13 5 - 15  CBC with Differential  Result Value Ref Range   WBC 3.7 (L) 4.0 - 10.5 K/uL   RBC 5.11 4.22 - 5.81 MIL/uL   Hemoglobin 15.5 13.0 - 17.0 g/dL   HCT 41.8 39 - 52 %   MCV 81.8 80.0 - 100.0 fL   MCH 30.3 26.0 - 34.0 pg   MCHC 37.1 (H) 30.0 - 36.0 g/dL   RDW 12.0 11.5 - 15.5 %   Platelets 222 150 - 400 K/uL   nRBC 0.0 0.0 - 0.2 %   Neutrophils Relative % 70 %   Neutro Abs 2.7 1.7 - 7.7 K/uL   Lymphocytes Relative 22 %   Lymphs Abs 0.8 0.7 - 4.0 K/uL   Monocytes Relative 6 %   Monocytes Absolute 0.2 0 - 1 K/uL   Eosinophils Relative 0 %   Eosinophils Absolute 0.0 0 - 0 K/uL   Basophils Relative 1 %   Basophils Absolute 0.0 0 - 0 K/uL   RBC Morphology MORPHOLOGY UNREMARKABLE    Smear Review Reviewed    Immature Granulocytes 1 %   Abs Immature Granulocytes 0.04 0.00 - 0.07 K/uL   Reactive, Benign Lymphocytes PRESENT   Fibrin derivatives D-Dimer  Result Value Ref Range   Fibrin derivatives D-dimer ([facility]) 1,864.60 (H) 0.00 - 499.00 ng/mL (FEU)  Urinalysis, Complete w Microscopic  Result Value Ref Range   Color, Urine YELLOW (A) YELLOW   APPearance CLEAR (A) CLEAR   Specific Gravity, Urine 1.010 1.005 - 1.030   pH 7.0 5.0 - 8.0   Glucose, UA NEGATIVE NEGATIVE mg/dL   Hgb urine dipstick NEGATIVE NEGATIVE   Bilirubin Urine NEGATIVE NEGATIVE   Ketones, ur 20 (A) NEGATIVE mg/dL   Protein, ur NEGATIVE NEGATIVE mg/dL   Nitrite NEGATIVE NEGATIVE   Leukocytes,Ua NEGATIVE NEGATIVE   WBC, UA 0-5 0 - 5 WBC/hpf   Bacteria, UA NONE SEEN NONE SEEN   Squamous Epithelial / LPF NONE SEEN 0 - 5  Troponin I (High Sensitivity)  Result Value Ref Range   Troponin I (High Sensitivity) 7 <18 ng/L   Troponin I (High Sensitivity)  Result Value Ref Range   Troponin I (High Sensitivity) 7 <18 ng/L      Assessment & Plan:   Problem List Items Addressed This Visit      Respiratory   Pneumonia due to COVID-19 virus - Primary    Tested positive on June 30th, improving.  Continue quarantine until symptoms improved.  Continue Albuterol as needed, refills on Tessalon sent.  Recommend increase rest  and hydration at home.  If any worsening SOB or any CP presents advised him to return immediately to ER setting.  Will plan to follow-up with him in one week.  Work note provided.  Repeat CXR on August 19th or around this date.      Relevant Medications   benzonatate (TESSALON) 200 MG capsule      I discussed the assessment and treatment plan with the patient. The patient was provided an opportunity to ask questions and all were answered. The patient agreed with the plan and demonstrated an understanding of the instructions.   The patient was advised to call back or seek an in-person evaluation if the symptoms worsen or if the condition fails to improve as anticipated.   I provided 21+ minutes of time during this encounter.  Follow up plan: Return in about 1 week (around 05/25/2020) for covid PNA.

## 2020-05-18 NOTE — Assessment & Plan Note (Addendum)
Tested positive on June 30th, improving.  Continue quarantine until symptoms improved.  Continue Albuterol as needed, refills on Tessalon sent.  Recommend increase rest and hydration at home.  If any worsening SOB or any CP presents advised him to return immediately to ER setting.  Will plan to follow-up with him in one week.  Work note provided.  Repeat CXR on August 19th or around this date.

## 2020-05-31 ENCOUNTER — Telehealth: Payer: Self-pay | Admitting: Nurse Practitioner

## 2020-05-31 NOTE — Telephone Encounter (Signed)
LVM for pt to call back.

## 2020-05-31 NOTE — Telephone Encounter (Signed)
-----   Message from Venita Lick, NP sent at 05/18/2020  5:19 PM EDT ----- 1 week Covid follow-up need

## 2020-06-03 ENCOUNTER — Other Ambulatory Visit: Payer: Self-pay | Admitting: Nurse Practitioner

## 2020-06-07 NOTE — Telephone Encounter (Signed)
LVM for pt to call back.

## 2020-06-29 ENCOUNTER — Ambulatory Visit: Payer: 59 | Admitting: Nurse Practitioner

## 2020-08-31 ENCOUNTER — Other Ambulatory Visit: Payer: Self-pay | Admitting: Nurse Practitioner

## 2020-08-31 DIAGNOSIS — R739 Hyperglycemia, unspecified: Secondary | ICD-10-CM

## 2020-09-06 ENCOUNTER — Other Ambulatory Visit: Payer: Self-pay | Admitting: Unknown Physician Specialty

## 2020-09-06 NOTE — Telephone Encounter (Signed)
Requested Prescriptions  Pending Prescriptions Disp Refills   omeprazole (PRILOSEC) 20 MG capsule [Pharmacy Med Name: OMEPRAZOLE DR 20 MG CAPSULE] 90 capsule 3    Sig: TAKE 1 CAPSULE BY MOUTH EVERY DAY     Gastroenterology: Proton Pump Inhibitors Passed - 09/06/2020  1:25 AM      Passed - Valid encounter within last 12 months    Recent Outpatient Visits          3 months ago Pneumonia due to COVID-19 virus   [facility], Henrine Screws T, NP   3 months ago Lab test positive for detection of COVID-19 virus   [facility], [place] T, NP   8 months ago Depression, major, single episode, severe ([place])   [place], Jolene T, NP   1 year ago Current moderate episode of major depressive disorder without prior episode ([place])   [place], Jolene T, NP   2 years ago [facility], Wendee Beavers, [place]

## 2020-11-08 IMAGING — DX DG CHEST 1V PORT
1 series · 1 of 1 positions shown · non-contrast
Comparison: Chest x-ray dated October 14, 2012.

CLINICAL DATA: COVID positive. Worsening symptoms over the past
week.

EXAM:
PORTABLE CHEST 1 VIEW

[chest ap]
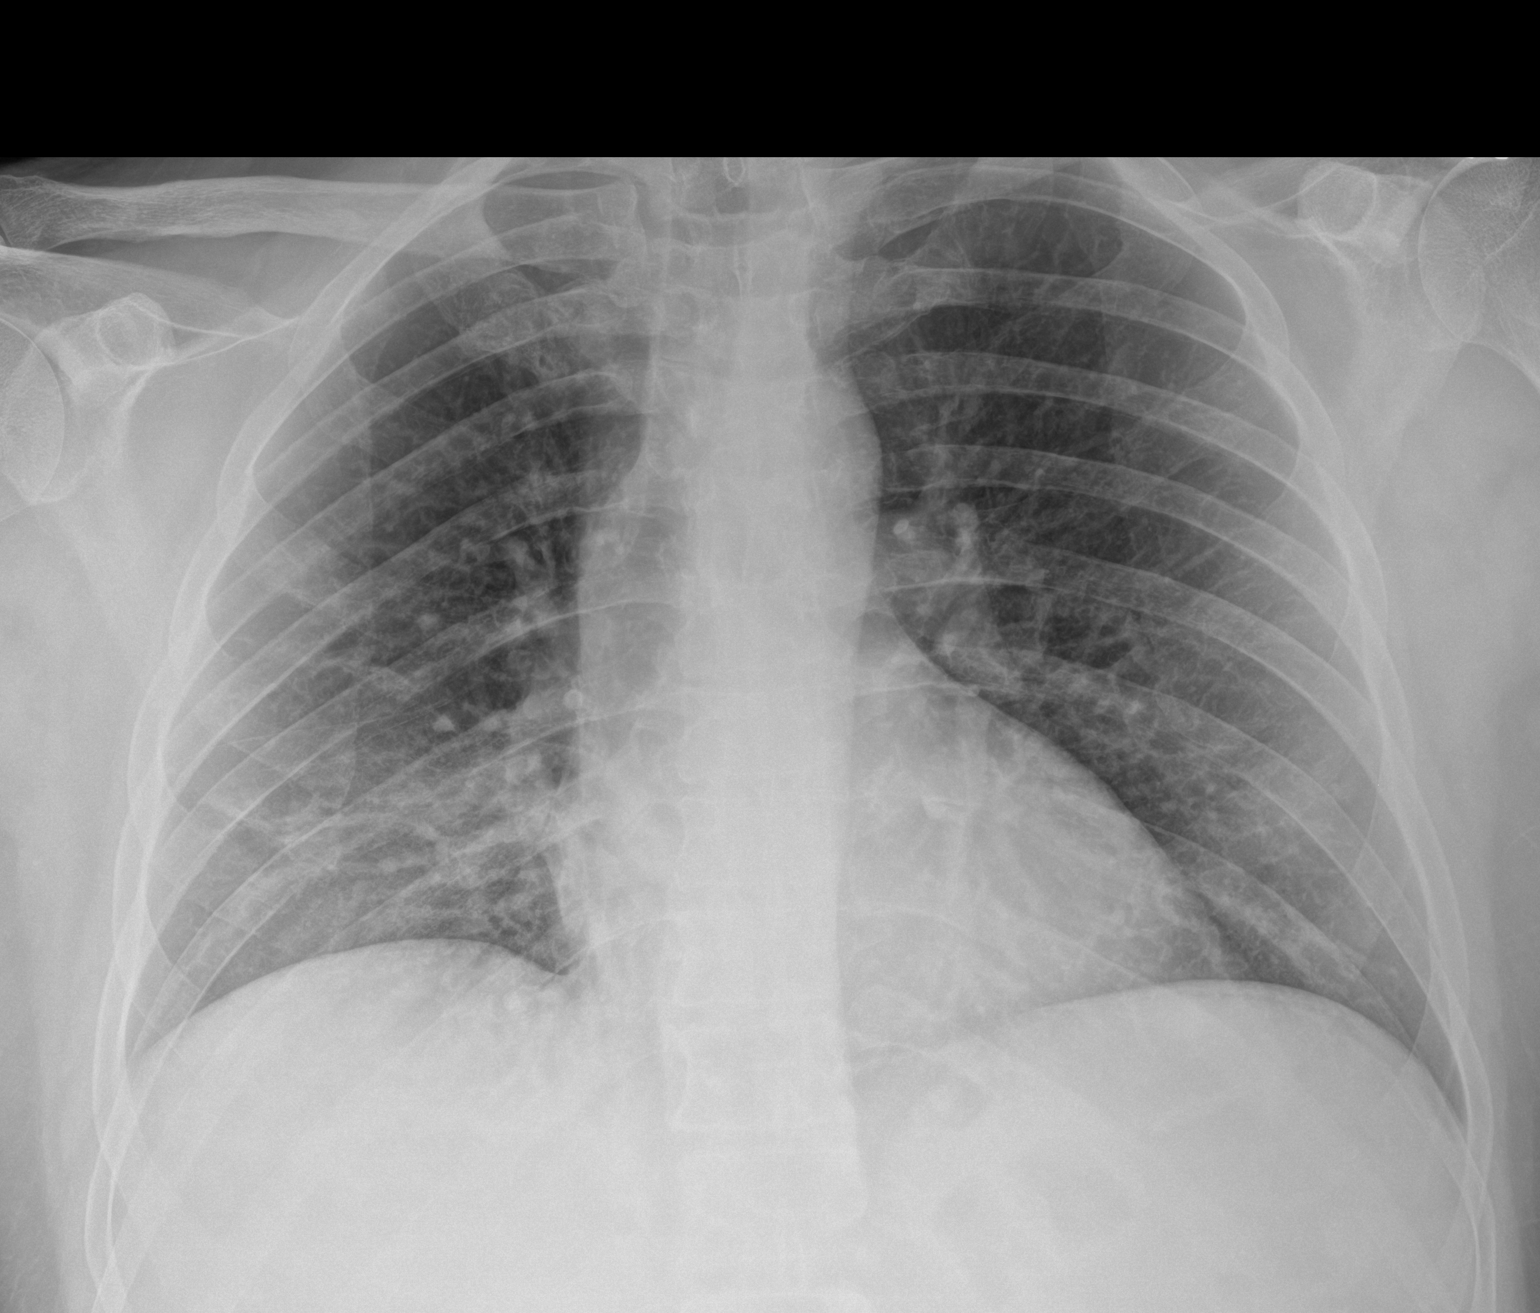

[1 of 1 positions shown; findings below may reference images not displayed]

FINDINGS: The heart size and mediastinal contours are within normal limits.
Normal pulmonary vascularity. Small opacities in the right mid and
lower lung. No focal consolidation, pleural effusion, or
pneumothorax. No acute osseous abnormality.
IMPRESSION: 1. Small opacities in the right mid and lower lung, consistent with
[id number] pneumonia given clinical history.

## 2020-12-31 ENCOUNTER — Other Ambulatory Visit: Payer: Self-pay | Admitting: Nurse Practitioner

## 2020-12-31 NOTE — Telephone Encounter (Signed)
Scheduled

## 2020-12-31 NOTE — Telephone Encounter (Signed)
Requested medication (s) are due for refill today: yes  Requested medication (s) are on the active medication list: yes  Last refill: 10/04/2020  Future visit scheduled:no  Notes to clinic: overdue for follow up appointment Vm left for patient to callback and schedule office visit   Requested Prescriptions  Pending Prescriptions Disp Refills   DULoxetine (CYMBALTA) 60 MG capsule [Pharmacy Med Name: DULOXETINE HCL DR 60 MG CAP] 90 capsule 3    Sig: TAKE 1 CAPSULE BY MOUTH EVERY DAY.      Psychiatry: Antidepressants - SNRI Failed - 12/31/2020  3:05 PM      Failed - Completed PHQ-2 or PHQ-9 in the last 360 days      Failed - Valid encounter within last 6 months    Recent Outpatient Visits           7 months ago Pneumonia due to COVID-19 virus   [facility], Barbaraann Faster, NP   7 months ago Lab test positive for detection of COVID-19 virus   [facility], [place] T, NP   1 year ago Depression, major, single episode, severe ([place])   [facility], Jolene T, NP   2 years ago Current moderate episode of major depressive disorder without prior episode ([place])   [place], Astatula T, NP   2 years ago [place], PA-C                Passed - Last BP in normal range    BP Readings from Last 1 Encounters:  05/13/20 120/85

## 2021-01-28 ENCOUNTER — Ambulatory Visit: Payer: 59 | Admitting: Nurse Practitioner

## 2021-02-01 ENCOUNTER — Encounter: Payer: Self-pay | Admitting: Nurse Practitioner

## 2021-02-01 ENCOUNTER — Ambulatory Visit: Payer: 59 | Admitting: Nurse Practitioner

## 2021-02-01 ENCOUNTER — Other Ambulatory Visit: Payer: Self-pay

## 2021-02-01 VITALS — BP 122/78 | HR 71 | Temp 98.1°F | Wt 176.2 lb

## 2021-02-01 DIAGNOSIS — F322 Major depressive disorder, single episode, severe without psychotic features: Secondary | ICD-10-CM

## 2021-02-01 DIAGNOSIS — R5383 Other fatigue: Secondary | ICD-10-CM | POA: Diagnosis not present

## 2021-02-01 DIAGNOSIS — Z1159 Encounter for screening for other viral diseases: Secondary | ICD-10-CM

## 2021-02-01 DIAGNOSIS — F419 Anxiety disorder, unspecified: Secondary | ICD-10-CM

## 2021-02-01 DIAGNOSIS — Z13220 Encounter for screening for lipoid disorders: Secondary | ICD-10-CM

## 2021-02-01 DIAGNOSIS — E559 Vitamin D deficiency, unspecified: Secondary | ICD-10-CM

## 2021-02-01 DIAGNOSIS — G894 Chronic pain syndrome: Secondary | ICD-10-CM

## 2021-02-01 DIAGNOSIS — Z114 Encounter for screening for human immunodeficiency virus [HIV]: Secondary | ICD-10-CM

## 2021-02-01 DIAGNOSIS — Z8616 Personal history of COVID-19: Secondary | ICD-10-CM | POA: Insufficient documentation

## 2021-02-01 DIAGNOSIS — F1190 Opioid use, unspecified, uncomplicated: Secondary | ICD-10-CM

## 2021-02-01 MED ORDER — LATUDA 20 MG PO TABS: 20.0000 mg | ORAL_TABLET | Freq: Every evening | ORAL | 4 refills | Status: DC

## 2021-02-01 NOTE — Progress Notes (Signed)
BP 122/78   Pulse 71   Temp 98.1 F (36.7 C) (Oral)   Wt 176 lb 3.2 oz (79.9 kg)   SpO2 97%   BMI 26.79 kg/m    Subjective:    Patient ID: William Herring, male    DOB: Mar 14, 1971, 50 y.o.   MRN: [id number]  HPI: William Herring is a 50 y.o. male  Chief Complaint  Patient presents with  . Medication Management    Patient denies having any questions or concerns at today's visit. Patient is here for refill request.   ANXIETY/STRESS Continues on Duloxetine, he quit taking Klonopin last year due to concerns with memory loss.  He is also followed by pain clinic for chronic pain and taking occasional Norco.  Last Norco fill 01/04/21 for #90 pills by pain management.  In past has tried SSRI, SNRI, multiple medications and had been resistant he reports. In past did attend therapy for short period.  His mother has depression, he does not know what she takes.    He does endorse ongoing chronic fatigue with normal sleep pattern.   Duration:stable Anxious mood: no  Excessive worrying: no Irritability: no  Sweating: no Nausea: no Palpitations:no Hyperventilation: no Panic attacks: no Agoraphobia: no  Obscessions/compulsions: no Depressed mood: yes Depression screen [facility] 2/9 02/01/2021 01/01/2020 09/16/2018 04/16/2018 01/25/2018  Decreased Interest 3 2 2 2 3   Down, Depressed, Hopeless 3 0 3 2 2   PHQ - 2 Score 6 2 5 4 5   Altered sleeping 0 0 1 1 1   Tired, decreased energy 3 3 3 2 3   Change in appetite 0 0 2 0 1  Feeling bad or failure about yourself  3 0 2 1 2   Trouble concentrating - 0 1 0 0  Moving slowly or fidgety/restless 0 0 1 1 1   Suicidal thoughts 0 0 0 0 0  PHQ-9 Score 12 5 15 9 13   Difficult doing work/chores Somewhat difficult Not difficult at all Somewhat difficult - -  Some recent data might be hidden  Anhedonia: yed Weight changes: no Insomnia: none Hypersomnia: no Fatigue/loss of energy: yes Feelings of worthlessness: sometimes Feelings of guilt: no Impaired  concentration/indecisiveness: yes Suicidal ideations: no  Crying spells: no Recent Stressors/Life Changes: yes   Relationship problems: no   Family stress: yes   Financial stress: no    Job stress: yes   Recent death/loss: no GAD 7 : Generalized Anxiety Score 02/01/2021 01/01/2020 09/16/2018  Nervous, Anxious, on Edge 1 0 0  Control/stop worrying 0 0 1  Worry too much - different things 0 0 1  Trouble relaxing 0 0 0  Restless 0 0 0  Easily annoyed or irritable 2 0 2  Afraid - awful might happen 0 0 0  Total GAD 7 Score 3 0 4  Anxiety Difficulty Somewhat difficult Not difficult at all Somewhat difficult    Relevant past medical, surgical, family and social history reviewed and updated as indicated. Interim medical history since our last visit reviewed. Allergies and medications reviewed and updated.  Review of Systems  Constitutional: Positive for fatigue. Negative for activity change, diaphoresis and fever.  Respiratory: Negative for cough, chest tightness, shortness of breath and wheezing.   Cardiovascular: Negative for chest pain, palpitations and leg swelling.  Gastrointestinal: Negative.   Musculoskeletal: Positive for back pain.  Neurological: Negative.   Psychiatric/Behavioral: Negative for decreased concentration, self-injury, sleep disturbance and suicidal ideas. The patient is not nervous/anxious.     Per HPI unless  specifically indicated above     Objective:    BP 122/78   Pulse 71   Temp 98.1 F (36.7 C) (Oral)   Wt 176 lb 3.2 oz (79.9 kg)   SpO2 97%   BMI 26.79 kg/m   Wt Readings from Last 3 Encounters:  02/01/21 176 lb 3.2 oz (79.9 kg)  05/13/20 185 lb 3 oz (84 kg)  09/16/18 186 lb (84.4 kg)    Physical Exam Vitals and nursing note reviewed.  Constitutional:      General: He is awake. He is not in acute distress.    Appearance: He is well-developed, well-groomed and overweight. He is not ill-appearing.  HENT:     Head: Normocephalic and atraumatic.      Right Ear: Hearing normal. No drainage.     Left Ear: Hearing normal. No drainage.  Eyes:     General: Lids are normal.        Right eye: No discharge.        Left eye: No discharge.     Conjunctiva/sclera: Conjunctivae normal.     Pupils: Pupils are equal, round, and reactive to light.  Neck:     Thyroid: No thyromegaly.     Vascular: No carotid bruit.     Trachea: Trachea normal.  Cardiovascular:     Rate and Rhythm: Normal rate and regular rhythm.     Heart sounds: Normal heart sounds, S1 normal and S2 normal. No murmur heard. No gallop.   Pulmonary:     Effort: Pulmonary effort is normal. No accessory muscle usage or respiratory distress.     Breath sounds: Normal breath sounds.  Abdominal:     General: Bowel sounds are normal.     Palpations: Abdomen is soft.  Musculoskeletal:        General: Normal range of motion.     Cervical back: Normal range of motion and neck supple.     Right lower leg: No edema.     Left lower leg: No edema.  Skin:    General: Skin is warm and dry.     Capillary Refill: Capillary refill takes less than 2 seconds.  Neurological:     Mental Status: He is alert and oriented to person, place, and time.  Psychiatric:        Attention and Perception: Attention normal.        Mood and Affect: Mood normal.        Speech: Speech normal.        Behavior: Behavior normal. Behavior is cooperative.        Thought Content: Thought content normal.    Results for orders placed or performed during the hospital encounter of 05/13/20  Comprehensive metabolic panel  Result Value Ref Range   Sodium 139 135 - 145 mmol/L   Potassium 3.6 3.5 - 5.1 mmol/L   Chloride 96 (L) 98 - 111 mmol/L   CO2 30 22 - 32 mmol/L   Glucose, Bld 144 (H) 70 - 99 mg/dL   BUN 12 6 - 20 mg/dL   Creatinine, Ser 0.74 0.61 - 1.24 mg/dL   Calcium 9.5 8.9 - 10.3 mg/dL   Total Protein 8.4 (H) 6.5 - 8.1 g/dL   Albumin 4.4 3.5 - 5.0 g/dL   AST 75 (H) 15 - 41 U/L   ALT 43 0 - 44 U/L    Alkaline Phosphatase 77 38 - 126 U/L   Total Bilirubin 0.9 0.3 - 1.2 mg/dL   GFR calc non  Af Amer >60 >60 mL/min   GFR calc Af Amer >60 >60 mL/min   Anion gap 13 5 - 15  CBC with Differential  Result Value Ref Range   WBC 3.7 (L) 4.0 - 10.5 K/uL   RBC 5.11 4.22 - 5.81 MIL/uL   Hemoglobin 15.5 13.0 - 17.0 g/dL   HCT 41.8 39.0 - 52.0 %   MCV 81.8 80.0 - 100.0 fL   MCH 30.3 26.0 - 34.0 pg   MCHC 37.1 (H) 30.0 - 36.0 g/dL   RDW 12.0 11.5 - 15.5 %   Platelets 222 150 - 400 K/uL   nRBC 0.0 0.0 - 0.2 %   Neutrophils Relative % 70 %   Neutro Abs 2.7 1.7 - 7.7 K/uL   Lymphocytes Relative 22 %   Lymphs Abs 0.8 0.7 - 4.0 K/uL   Monocytes Relative 6 %   Monocytes Absolute 0.2 0.1 - 1.0 K/uL   Eosinophils Relative 0 %   Eosinophils Absolute 0.0 0.0 - 0.5 K/uL   Basophils Relative 1 %   Basophils Absolute 0.0 0.0 - 0.1 K/uL   RBC Morphology MORPHOLOGY UNREMARKABLE    Smear Review Reviewed    Immature Granulocytes 1 %   Abs Immature Granulocytes 0.04 0.00 - 0.07 K/uL   Reactive, Benign Lymphocytes PRESENT   Fibrin derivatives D-Dimer  Result Value Ref Range   Fibrin derivatives D-dimer ([facility]) 1,864.60 (H) 0.00 - 499.00 ng/mL (FEU)  Urinalysis, Complete w Microscopic  Result Value Ref Range   Color, Urine YELLOW (A) YELLOW   APPearance CLEAR (A) CLEAR   Specific Gravity, Urine 1.010 1.005 - 1.030   pH 7.0 5.0 - 8.0   Glucose, UA NEGATIVE NEGATIVE mg/dL   Hgb urine dipstick NEGATIVE NEGATIVE   Bilirubin Urine NEGATIVE NEGATIVE   Ketones, ur 20 (A) NEGATIVE mg/dL   Protein, ur NEGATIVE NEGATIVE mg/dL   Nitrite NEGATIVE NEGATIVE   Leukocytes,Ua NEGATIVE NEGATIVE   WBC, UA 0-5 0 - 5 WBC/hpf   Bacteria, UA NONE SEEN NONE SEEN   Squamous Epithelial / LPF NONE SEEN 0 - 5  Troponin I (High Sensitivity)  Result Value Ref Range   Troponin I (High Sensitivity) 7 <18 ng/L  Troponin I (High Sensitivity)  Result Value Ref Range   Troponin I (High Sensitivity) 7 <18 ng/L       Assessment & Plan:   Problem List Items Addressed This Visit      Other   Chronic pain syndrome    Followed by pain clinic at [facility].      Anxiety    Refer to depression plan of care.      Fatigue    Ongoing issue, ?related to mood.  Will plan for outpatient labs in early morning, discussed with patient.  Plan to check CBC, CMP, Testosterone, TSH, Vitamin D.      Relevant Orders   TSH   Comprehensive metabolic panel   VITAMIN D 25 Hydroxy (Vit-D Deficiency, Fractures)   Testosterone, free, total([facility])   CBC with Differential/Platelet   Ambulatory referral to Psychiatry   Chronic, continuous use of opioids    Followed by pain clinic at [facility].      Depression, major, single episode, severe (HCC) - Primary    Chronic, ongoing -- PHQ 12 and GAD 3 today.  Denies SI/HI.  Has been resistant to multiple medications in past.  Continue current medication regimen and adjust as needed, will add on trial of Latuda 20 MG every evening to assess if  benefit.  Could consider Trintellix in future if ongoing poor control.  At this time will place referral to psychiatry, discussed with him that he may benefit from genetic testing.  Will obtain outpatient labs.  Return in 6 months, sooner if worsening mood.      Relevant Orders   Ambulatory referral to Psychiatry    Other Visit Diagnoses    Vitamin D deficiency       Check Vitamin D level outpatient and start supplement as needed.   Relevant Orders   VITAMIN D 25 Hydroxy (Vit-D Deficiency, Fractures)   Encounter for screening for HIV       HIV screening today per CDC guidelines for one time screening.  Discussed with patient.   Relevant Orders   HIV Antibody (routine testing w rflx)   Need for hepatitis C screening test       Hep C screening today per CDC guidelines for one time screening.  Discussed with patient.   Relevant Orders   Hepatitis C antibody   Screening for cholesterol level       Lipid panel on outpatient labs.    Relevant Orders   Lipid Panel w/o Chol/HDL Ratio       Follow up plan: Return in about 6 months (around 08/04/2021) for Depression.

## 2021-02-01 NOTE — Assessment & Plan Note (Signed)
Chronic, ongoing -- PHQ 12 and GAD 3 today.  Denies SI/HI.  Has been resistant to multiple medications in past.  Continue current medication regimen and adjust as needed, will add on trial of Latuda 20 MG every evening to assess if benefit.  Could consider Trintellix in future if ongoing poor control.  At this time will place referral to psychiatry, discussed with him that he may benefit from genetic testing.  Will obtain outpatient labs.  Return in 6 months, sooner if worsening mood.

## 2021-02-01 NOTE — Assessment & Plan Note (Signed)
Refer to depression plan of care. 

## 2021-02-01 NOTE — Assessment & Plan Note (Signed)
Followed by pain clinic at [facility]. 

## 2021-02-01 NOTE — Patient Instructions (Signed)

## 2021-02-01 NOTE — Assessment & Plan Note (Signed)
Ongoing issue, ?related to mood.  Will plan for outpatient labs in early morning, discussed with patient.  Plan to check CBC, CMP, Testosterone, TSH, Vitamin D.

## 2021-03-28 ENCOUNTER — Other Ambulatory Visit: Payer: Self-pay | Admitting: Nurse Practitioner

## 2021-08-05 ENCOUNTER — Encounter: Payer: Self-pay | Admitting: Nurse Practitioner

## 2021-08-05 ENCOUNTER — Telehealth (INDEPENDENT_AMBULATORY_CARE_PROVIDER_SITE_OTHER): Payer: 59 | Admitting: Nurse Practitioner

## 2021-08-05 DIAGNOSIS — F419 Anxiety disorder, unspecified: Secondary | ICD-10-CM | POA: Diagnosis not present

## 2021-08-05 DIAGNOSIS — F322 Major depressive disorder, single episode, severe without psychotic features: Secondary | ICD-10-CM | POA: Diagnosis not present

## 2021-08-05 DIAGNOSIS — F1190 Opioid use, unspecified, uncomplicated: Secondary | ICD-10-CM | POA: Diagnosis not present

## 2021-08-05 DIAGNOSIS — G894 Chronic pain syndrome: Secondary | ICD-10-CM | POA: Diagnosis not present

## 2021-08-05 MED ORDER — DULOXETINE HCL 60 MG PO CPEP: ORAL_CAPSULE | ORAL | 4 refills | Status: DC

## 2021-08-05 NOTE — Assessment & Plan Note (Signed)
Refer to depression plan of care. 

## 2021-08-05 NOTE — Patient Instructions (Signed)

## 2021-08-05 NOTE — Assessment & Plan Note (Signed)
Followed by pain clinic at [facility]. 

## 2021-08-05 NOTE — Progress Notes (Signed)
There were no vitals taken for this visit.   Subjective:    Patient ID: William Herring, male    DOB: 04/08/1971, 50 y.o.   MRN: [id number]  HPI: William Herring is a 50 y.o. male  Chief Complaint  Patient presents with   Depression   This visit was completed via video visit through MyChart due to the restrictions of the COVID-19 pandemic. All issues as above were discussed and addressed. Physical exam was done as above through visual confirmation on video through MyChart. If it was felt that the patient should be evaluated in the office, they were directed there. The patient verbally consented to this visit. Location of the patient: home Location of the provider: work Those involved with this call:  Provider: Marnee Guarneri, DNP CMA: Yvonna Alanis, [place] Desk/Registration: Earlene Plater Djimraou  Time spent on call:  21 minutes with patient face to face via video conference. More than 50% of this time was spent in counseling and coordination of care. 15 minutes total spent in review of patient's record and preparation of their chart.  I verified patient identity using two factors (patient name and date of birth). Patient consents verbally to being seen via telemedicine visit today.    ANXIETY/STRESS Tried Latuda last visit with no benefit.  Continues on Duloxetine, he quit taking Klonopin over one year ago due to concerns with memory loss.  He is also followed by pain clinic for chronic pain and taking occasional Norco.  Last Norco fill 07/23/21 for #120 pills by pain management.  Placed referral last visit to psychiatry and he does not recall phone call. Insurance is changing next year to [employer].    In past has tried SSRI, SNRI, Wellbutrin, Latuda, multiple medications and had been resistant he reports. Did attend therapy for short period in past.  His mother has depression, he does not know what she takes.   Duration: stable, but not 100% Anxious mood: yes Excessive worrying:  no Irritability: no  Sweating: no Nausea: no Palpitations:no Hyperventilation: no Panic attacks: no Agoraphobia: no  Obscessions/compulsions: no Depressed mood: yes Depression screen [facility] 2/9 08/05/2021 02/01/2021 01/01/2020 09/16/2018 04/16/2018  Decreased Interest 1 3 2 2 2   Down, Depressed, Hopeless 1 3 0 3 2  PHQ - 2 Score 2 6 2 5 4   Altered sleeping 0 0 0 1 1  Tired, decreased energy 0 3 3 3 2   Change in appetite 1 0 0 2 0  Feeling bad or failure about yourself  0 3 0 2 1  Trouble concentrating 0 - 0 1 0  Moving slowly or fidgety/restless 0 0 0 1 1  Suicidal thoughts 0 0 0 0 0  PHQ-9 Score 3 12 5 15 9   Difficult doing work/chores Somewhat difficult Somewhat difficult Not difficult at all Somewhat difficult -  Some recent data might be hidden  Anhedonia: yed Weight changes: no Insomnia: none Hypersomnia: no Fatigue/loss of energy: yes Feelings of worthlessness: sometimes Feelings of guilt: no Impaired concentration/indecisiveness: yes Suicidal ideations: no  Crying spells: no Recent Stressors/Life Changes: yes   Relationship problems: no   Family stress: yes   Financial stress: no    Job stress: yes   Recent death/loss: no GAD 7 : Generalized Anxiety Score 08/05/2021 02/01/2021 01/01/2020 09/16/2018  Nervous, Anxious, on Edge 1 1 0 0  Control/stop worrying 0 0 0 1  Worry too much - different things 1 0 0 1  Trouble relaxing 0 0 0 0  Restless 0 0 0 0  Easily annoyed or irritable 1 2 0 2  Afraid - awful might happen 0 0 0 0  Total GAD 7 Score 3 3 0 4  Anxiety Difficulty Somewhat difficult Somewhat difficult Not difficult at all Somewhat difficult    Relevant past medical, surgical, family and social history reviewed and updated as indicated. Interim medical history since our last visit reviewed. Allergies and medications reviewed and updated.  Review of Systems  Constitutional:  Positive for fatigue. Negative for activity change, diaphoresis and fever.  Respiratory:   Negative for cough, chest tightness, shortness of breath and wheezing.   Cardiovascular:  Negative for chest pain, palpitations and leg swelling.  Gastrointestinal: Negative.   Musculoskeletal:  Positive for back pain.  Neurological: Negative.   Psychiatric/Behavioral:  Negative for decreased concentration, self-injury, sleep disturbance and suicidal ideas. The patient is not nervous/anxious.    Per HPI unless specifically indicated above     Objective:    There were no vitals taken for this visit.  Wt Readings from Last 3 Encounters:  02/01/21 176 lb 3.2 oz (79.9 kg)  05/13/20 185 lb 3 oz (84 kg)  09/16/18 186 lb (84.4 kg)    Physical Exam Vitals and nursing note reviewed.  Constitutional:      General: He is awake. He is not in acute distress.    Appearance: He is well-developed and well-groomed. He is not ill-appearing or toxic-appearing.  HENT:     Head: Normocephalic.     Right Ear: Hearing normal. No drainage.     Left Ear: Hearing normal. No drainage.  Eyes:     General: Lids are normal.        Right eye: No discharge.        Left eye: No discharge.     Conjunctiva/sclera: Conjunctivae normal.  Pulmonary:     Effort: Pulmonary effort is normal. No accessory muscle usage or respiratory distress.  Musculoskeletal:     Cervical back: Normal range of motion.  Neurological:     Mental Status: He is alert and oriented to person, place, and time.  Psychiatric:        Mood and Affect: Mood normal.        Behavior: Behavior normal. Behavior is cooperative.        Thought Content: Thought content normal.        Judgment: Judgment normal.   Results for orders placed or performed during the hospital encounter of 05/13/20  Comprehensive metabolic panel  Result Value Ref Range   Sodium 139 135 - 145 mmol/L   Potassium 3.6 3.5 - 5.1 mmol/L   Chloride 96 (L) 98 - 111 mmol/L   CO2 30 22 - 32 mmol/L   Glucose, Bld 144 (H) 70 - 99 mg/dL   BUN 12 6 - 20 mg/dL   Creatinine,  Ser 0.74 0.61 - 1.24 mg/dL   Calcium 9.5 8.9 - 10.3 mg/dL   Total Protein 8.4 (H) 6.5 - 8.1 g/dL   Albumin 4.4 3.5 - 5.0 g/dL   AST 75 (H) 15 - 41 U/L   ALT 43 0 - 44 U/L   Alkaline Phosphatase 77 38 - 126 U/L   Total Bilirubin 0.9 0.3 - 1.2 mg/dL   GFR calc non Af Amer >60 >60 mL/min   GFR calc Af Amer >60 >60 mL/min   Anion gap 13 5 - 15  CBC with Differential  Result Value Ref Range   WBC 3.7 (L) 4.0 -  10.5 K/uL   RBC 5.11 4.22 - 5.81 MIL/uL   Hemoglobin 15.5 13.0 - 17.0 g/dL   HCT 41.8 39.0 - 52.0 %   MCV 81.8 80.0 - 100.0 fL   MCH 30.3 26.0 - 34.0 pg   MCHC 37.1 (H) 30.0 - 36.0 g/dL   RDW 12.0 11.5 - 15.5 %   Platelets 222 150 - 400 K/uL   nRBC 0.0 0.0 - 0.2 %   Neutrophils Relative % 70 %   Neutro Abs 2.7 1.7 - 7.7 K/uL   Lymphocytes Relative 22 %   Lymphs Abs 0.8 0.7 - 4.0 K/uL   Monocytes Relative 6 %   Monocytes Absolute 0.2 0.1 - 1.0 K/uL   Eosinophils Relative 0 %   Eosinophils Absolute 0.0 0.0 - 0.5 K/uL   Basophils Relative 1 %   Basophils Absolute 0.0 0.0 - 0.1 K/uL   RBC Morphology MORPHOLOGY UNREMARKABLE    Smear Review Reviewed    Immature Granulocytes 1 %   Abs Immature Granulocytes 0.04 0.00 - 0.07 K/uL   Reactive, Benign Lymphocytes PRESENT   Fibrin derivatives D-Dimer  Result Value Ref Range   Fibrin derivatives D-dimer ([facility]) 1,864.60 (H) 0.00 - 499.00 ng/mL (FEU)  Urinalysis, Complete w Microscopic  Result Value Ref Range   Color, Urine YELLOW (A) YELLOW   APPearance CLEAR (A) CLEAR   Specific Gravity, Urine 1.010 1.005 - 1.030   pH 7.0 5.0 - 8.0   Glucose, UA NEGATIVE NEGATIVE mg/dL   Hgb urine dipstick NEGATIVE NEGATIVE   Bilirubin Urine NEGATIVE NEGATIVE   Ketones, ur 20 (A) NEGATIVE mg/dL   Protein, ur NEGATIVE NEGATIVE mg/dL   Nitrite NEGATIVE NEGATIVE   Leukocytes,Ua NEGATIVE NEGATIVE   WBC, UA 0-5 0 - 5 WBC/hpf   Bacteria, UA NONE SEEN NONE SEEN   Squamous Epithelial / LPF NONE SEEN 0 - 5  Troponin I (High Sensitivity)  Result  Value Ref Range   Troponin I (High Sensitivity) 7 <18 ng/L  Troponin I (High Sensitivity)  Result Value Ref Range   Troponin I (High Sensitivity) 7 <18 ng/L      Assessment & Plan:   Problem List Items Addressed This Visit       Other   Chronic pain syndrome    Followed by pain clinic at [facility].  Continue this collaboration, recent note reviewed.      Relevant Medications   HYDROcodone-acetaminophen (NORCO) 10-325 MG tablet   DULoxetine (CYMBALTA) 60 MG capsule   Anxiety    Refer to depression plan of care.      Relevant Medications   DULoxetine (CYMBALTA) 60 MG capsule   Chronic, continuous use of opioids    Followed by pain clinic at [facility].      Depression, major, single episode, severe (HCC) - Primary    Chronic, ongoing -- PHQ 3 and GAD 3 today.  Denies SI/HI.  Has been resistant to multiple medications in past.  Continue current medication regimen and adjust as needed.  Could consider Trintellix in future if ongoing poor control.  At this time will place new referral to psychiatry, discussed with him that he may benefit from genetic testing.  Will obtain labs in future.  Return in 6 months, sooner if worsening mood.      Relevant Medications   DULoxetine (CYMBALTA) 60 MG capsule   Other Relevant Orders   Ambulatory referral to Psychiatry    I discussed the assessment and treatment plan with the patient. The patient was provided  an opportunity to ask questions and all were answered. The patient agreed with the plan and demonstrated an understanding of the instructions.   The patient was advised to call back or seek an in-person evaluation if the symptoms worsen or if the condition fails to improve as anticipated.   I provided 21+ minutes of time during this encounter.   Follow up plan: Return in about 6 months (around 02/02/2022) for Annual physical needed.

## 2021-08-05 NOTE — Assessment & Plan Note (Addendum)
Chronic, ongoing -- PHQ 3 and GAD 3 today.  Denies SI/HI.  Has been resistant to multiple medications in past.  Continue current medication regimen and adjust as needed.  Could consider Trintellix in future if ongoing poor control.  At this time will place new referral to psychiatry, discussed with him that he may benefit from genetic testing.  Will obtain labs in future.  Return in 6 months, sooner if worsening mood.

## 2021-08-05 NOTE — Assessment & Plan Note (Addendum)
Followed by pain clinic at [facility].  Continue this collaboration, recent note reviewed. 

## 2021-08-08 ENCOUNTER — Telehealth: Payer: Self-pay | Admitting: Nurse Practitioner

## 2021-08-08 NOTE — Telephone Encounter (Signed)
-----   Message from Venita Lick, NP sent at 08/05/2021  4:12 PM EDT ----- Annual physical needed

## 2021-08-08 NOTE — Telephone Encounter (Signed)
Called pt to schedule physical. Pt asked if he could call back to schedule appt.

## 2021-09-14 ENCOUNTER — Other Ambulatory Visit: Payer: Self-pay | Admitting: Nurse Practitioner

## 2021-09-14 NOTE — Telephone Encounter (Signed)
Requested medication (s) are due for refill today: Yes  Requested medication (s) are on the active medication list: Yes  Last refill:  09/06/21  Future visit scheduled: No  Notes to clinic:  Prescription expired.    Requested Prescriptions  Pending Prescriptions Disp Refills   omeprazole (PRILOSEC) 20 MG capsule [Pharmacy Med Name: OMEPRAZOLE DR 20 MG CAPSULE] 90 capsule 3    Sig: TAKE 1 CAPSULE BY MOUTH EVERY DAY     Gastroenterology: Proton Pump Inhibitors Passed - 09/14/2021  3:05 AM      Passed - Valid encounter within last 12 months    Recent Outpatient Visits           1 month ago Depression, major, single episode, severe ([place])   [place], Jolene T, NP   7 months ago Depression, major, single episode, severe ([place])   [place], Jolene T, NP   1 year ago Pneumonia due to COVID-19 virus   [employer], Barbaraann Faster, NP   1 year ago Lab test positive for detection of COVID-19 virus   [place], Loyal T, NP   1 year ago Depression, major, single episode, severe ([place])   [place], Barbaraann Faster, NP

## 2021-11-14 DIAGNOSIS — F331 Major depressive disorder, recurrent, moderate: Secondary | ICD-10-CM | POA: Diagnosis not present

## 2021-12-12 DIAGNOSIS — F331 Major depressive disorder, recurrent, moderate: Secondary | ICD-10-CM | POA: Diagnosis not present

## 2021-12-26 DIAGNOSIS — F411 Generalized anxiety disorder: Secondary | ICD-10-CM | POA: Diagnosis not present

## 2021-12-26 DIAGNOSIS — Z1331 Encounter for screening for depression: Secondary | ICD-10-CM | POA: Diagnosis not present

## 2021-12-26 DIAGNOSIS — Z1330 Encounter for screening examination for mental health and behavioral disorders, unspecified: Secondary | ICD-10-CM | POA: Diagnosis not present

## 2021-12-26 DIAGNOSIS — F332 Major depressive disorder, recurrent severe without psychotic features: Secondary | ICD-10-CM | POA: Diagnosis not present

## 2022-01-12 DIAGNOSIS — Z1231 Encounter for screening mammogram for malignant neoplasm of breast: Secondary | ICD-10-CM | POA: Diagnosis not present

## 2022-01-13 DIAGNOSIS — F331 Major depressive disorder, recurrent, moderate: Secondary | ICD-10-CM | POA: Diagnosis not present

## 2022-01-26 DIAGNOSIS — Z79891 Long term (current) use of opiate analgesic: Secondary | ICD-10-CM | POA: Diagnosis not present

## 2022-01-26 DIAGNOSIS — M961 Postlaminectomy syndrome, not elsewhere classified: Secondary | ICD-10-CM | POA: Diagnosis not present

## 2022-01-26 DIAGNOSIS — Z5181 Encounter for therapeutic drug level monitoring: Secondary | ICD-10-CM | POA: Diagnosis not present

## 2022-01-26 DIAGNOSIS — Z79899 Other long term (current) drug therapy: Secondary | ICD-10-CM | POA: Diagnosis not present

## 2022-01-26 DIAGNOSIS — M5416 Radiculopathy, lumbar region: Secondary | ICD-10-CM | POA: Diagnosis not present

## 2022-01-26 DIAGNOSIS — G894 Chronic pain syndrome: Secondary | ICD-10-CM | POA: Diagnosis not present

## 2022-02-10 DIAGNOSIS — F331 Major depressive disorder, recurrent, moderate: Secondary | ICD-10-CM | POA: Diagnosis not present

## 2022-03-10 DIAGNOSIS — Z23 Encounter for immunization: Secondary | ICD-10-CM | POA: Diagnosis not present

## 2022-04-13 DIAGNOSIS — G894 Chronic pain syndrome: Secondary | ICD-10-CM | POA: Diagnosis not present

## 2022-04-13 DIAGNOSIS — Z5181 Encounter for therapeutic drug level monitoring: Secondary | ICD-10-CM | POA: Diagnosis not present

## 2022-04-13 DIAGNOSIS — M961 Postlaminectomy syndrome, not elsewhere classified: Secondary | ICD-10-CM | POA: Diagnosis not present

## 2022-04-13 DIAGNOSIS — Z79891 Long term (current) use of opiate analgesic: Secondary | ICD-10-CM | POA: Diagnosis not present

## 2022-04-13 DIAGNOSIS — Z79899 Other long term (current) drug therapy: Secondary | ICD-10-CM | POA: Diagnosis not present

## 2022-05-04 DIAGNOSIS — F331 Major depressive disorder, recurrent, moderate: Secondary | ICD-10-CM | POA: Diagnosis not present

## 2022-05-04 DIAGNOSIS — F4010 Social phobia, unspecified: Secondary | ICD-10-CM | POA: Diagnosis not present

## 2022-05-31 DIAGNOSIS — X58XXXA Exposure to other specified factors, initial encounter: Secondary | ICD-10-CM | POA: Diagnosis not present

## 2022-05-31 DIAGNOSIS — S99912A Unspecified injury of left ankle, initial encounter: Secondary | ICD-10-CM | POA: Diagnosis not present

## 2022-05-31 DIAGNOSIS — S93402A Sprain of unspecified ligament of left ankle, initial encounter: Secondary | ICD-10-CM | POA: Diagnosis not present

## 2022-05-31 DIAGNOSIS — Y92832 Beach as the place of occurrence of the external cause: Secondary | ICD-10-CM | POA: Diagnosis not present

## 2022-06-26 DIAGNOSIS — R399 Unspecified symptoms and signs involving the genitourinary system: Secondary | ICD-10-CM | POA: Diagnosis not present

## 2022-06-26 DIAGNOSIS — R159 Full incontinence of feces: Secondary | ICD-10-CM | POA: Diagnosis not present

## 2022-06-26 DIAGNOSIS — Z131 Encounter for screening for diabetes mellitus: Secondary | ICD-10-CM | POA: Diagnosis not present

## 2022-06-26 DIAGNOSIS — Z1331 Encounter for screening for depression: Secondary | ICD-10-CM | POA: Diagnosis not present

## 2022-06-26 DIAGNOSIS — F332 Major depressive disorder, recurrent severe without psychotic features: Secondary | ICD-10-CM | POA: Diagnosis not present

## 2022-06-26 DIAGNOSIS — Z13220 Encounter for screening for lipoid disorders: Secondary | ICD-10-CM | POA: Diagnosis not present

## 2022-06-26 DIAGNOSIS — Z0000 Encounter for general adult medical examination without abnormal findings: Secondary | ICD-10-CM | POA: Diagnosis not present

## 2022-06-26 DIAGNOSIS — Z23 Encounter for immunization: Secondary | ICD-10-CM | POA: Diagnosis not present

## 2022-07-06 DIAGNOSIS — M961 Postlaminectomy syndrome, not elsewhere classified: Secondary | ICD-10-CM | POA: Diagnosis not present

## 2022-07-06 DIAGNOSIS — Z5181 Encounter for therapeutic drug level monitoring: Secondary | ICD-10-CM | POA: Diagnosis not present

## 2022-07-06 DIAGNOSIS — Z79899 Other long term (current) drug therapy: Secondary | ICD-10-CM | POA: Diagnosis not present

## 2022-07-06 DIAGNOSIS — M5416 Radiculopathy, lumbar region: Secondary | ICD-10-CM | POA: Diagnosis not present

## 2022-07-06 DIAGNOSIS — Z79891 Long term (current) use of opiate analgesic: Secondary | ICD-10-CM | POA: Diagnosis not present

## 2022-07-06 DIAGNOSIS — G894 Chronic pain syndrome: Secondary | ICD-10-CM | POA: Diagnosis not present

## 2022-07-26 DIAGNOSIS — R059 Cough, unspecified: Secondary | ICD-10-CM | POA: Diagnosis not present

## 2022-07-26 DIAGNOSIS — J069 Acute upper respiratory infection, unspecified: Secondary | ICD-10-CM | POA: Diagnosis not present

## 2022-10-03 DIAGNOSIS — M5416 Radiculopathy, lumbar region: Secondary | ICD-10-CM | POA: Diagnosis not present

## 2022-10-03 DIAGNOSIS — M961 Postlaminectomy syndrome, not elsewhere classified: Secondary | ICD-10-CM | POA: Diagnosis not present

## 2022-10-03 DIAGNOSIS — G894 Chronic pain syndrome: Secondary | ICD-10-CM | POA: Diagnosis not present

## 2022-10-03 DIAGNOSIS — Z79891 Long term (current) use of opiate analgesic: Secondary | ICD-10-CM | POA: Diagnosis not present

## 2022-10-31 ENCOUNTER — Telehealth: Payer: Self-pay | Admitting: Nurse Practitioner

## 2022-10-31 NOTE — Telephone Encounter (Signed)
Spoke with patient and informed him that all screenings will be addressed at his appointment for physical

## 2022-10-31 NOTE — Telephone Encounter (Signed)
Copied from [place] [id number]. Topic: General - Other >> Oct 31, 2022  2:37 PM Ja-Kwan M wrote: Reason for CRM: Pt has an appt for a physical on 11/08/22 but would like to be contacted about getting cancer screening done asap

## 2022-11-02 DIAGNOSIS — Z87891 Personal history of nicotine dependence: Secondary | ICD-10-CM | POA: Diagnosis not present

## 2022-11-02 DIAGNOSIS — N210 Calculus in bladder: Secondary | ICD-10-CM | POA: Diagnosis not present

## 2022-11-02 DIAGNOSIS — R109 Unspecified abdominal pain: Secondary | ICD-10-CM | POA: Diagnosis not present

## 2022-11-02 DIAGNOSIS — M545 Low back pain, unspecified: Secondary | ICD-10-CM | POA: Diagnosis not present

## 2022-11-02 DIAGNOSIS — N200 Calculus of kidney: Secondary | ICD-10-CM | POA: Diagnosis not present

## 2022-11-06 NOTE — Patient Instructions (Incomplete)

## 2022-11-07 ENCOUNTER — Telehealth: Payer: Self-pay

## 2022-11-07 NOTE — Telephone Encounter (Signed)
Transition Care Management Follow-up Telephone Call Date of discharge and from where: 11/02/22, [facility] How have you been since you were released from the hospital? A little better.  Any questions or concerns? No  Items Reviewed: Did the pt receive and understand the discharge instructions provided? Yes  Medications obtained and verified? Yes  Other? No  Any new allergies since your discharge? No  Dietary orders reviewed? Yes Do you have support at home? Yes   Home Care and Equipment/Supplies: Were home health services ordered? no If so, what is the name of the agency? N/A  Has the agency set up a time to come to the patient's home? not applicable Were any new equipment or medical supplies ordered?  No What is the name of the medical supply agency? N/A Were you able to get the supplies/equipment? not applicable Do you have any questions related to the use of the equipment or supplies? No  Functional Questionnaire: (I = Independent and D = Dependent) ADLs: I  Bathing/Dressing- I  Meal Prep- I  Eating- I  Maintaining continence- I  Transferring/Ambulation- I  Managing Meds- I  Follow up appointments reviewed:  PCP Hospital f/u appt confirmed? Yes  Scheduled to see William Guarneri, DNP on 11/08/22 @ 1:20 PM. [facility] f/u appt confirmed? No   Are transportation arrangements needed? No  If their condition worsens, is the pt aware to call PCP or go to the Emergency Dept.? Yes Was the patient provided with contact information for the PCP's office or ED? Yes Was to pt encouraged to call back with questions or concerns? Yes

## 2022-11-07 NOTE — Telephone Encounter (Signed)
Transition Care Management Unsuccessful Follow-up Telephone Call  Date of discharge and from where:  11/02/22. [facility]  Attempts:  1st Attempt  Reason for unsuccessful TCM follow-up call:  Left voice message

## 2022-11-08 ENCOUNTER — Ambulatory Visit (INDEPENDENT_AMBULATORY_CARE_PROVIDER_SITE_OTHER): Payer: BC Managed Care – PPO | Admitting: Nurse Practitioner

## 2022-11-08 ENCOUNTER — Encounter: Payer: Self-pay | Admitting: Nurse Practitioner

## 2022-11-08 VITALS — BP 128/81 | HR 78 | Temp 98.3°F | Ht 67.99 in | Wt 176.4 lb

## 2022-11-08 DIAGNOSIS — G894 Chronic pain syndrome: Secondary | ICD-10-CM

## 2022-11-08 DIAGNOSIS — Z136 Encounter for screening for cardiovascular disorders: Secondary | ICD-10-CM | POA: Diagnosis not present

## 2022-11-08 DIAGNOSIS — Z13220 Encounter for screening for lipoid disorders: Secondary | ICD-10-CM | POA: Diagnosis not present

## 2022-11-08 DIAGNOSIS — Z1159 Encounter for screening for other viral diseases: Secondary | ICD-10-CM | POA: Diagnosis not present

## 2022-11-08 DIAGNOSIS — F419 Anxiety disorder, unspecified: Secondary | ICD-10-CM | POA: Diagnosis not present

## 2022-11-08 DIAGNOSIS — N400 Enlarged prostate without lower urinary tract symptoms: Secondary | ICD-10-CM

## 2022-11-08 DIAGNOSIS — Z23 Encounter for immunization: Secondary | ICD-10-CM

## 2022-11-08 DIAGNOSIS — F322 Major depressive disorder, single episode, severe without psychotic features: Secondary | ICD-10-CM | POA: Diagnosis not present

## 2022-11-08 DIAGNOSIS — Z1211 Encounter for screening for malignant neoplasm of colon: Secondary | ICD-10-CM

## 2022-11-08 DIAGNOSIS — Z0000 Encounter for general adult medical examination without abnormal findings: Secondary | ICD-10-CM

## 2022-11-08 DIAGNOSIS — Z87442 Personal history of urinary calculi: Secondary | ICD-10-CM | POA: Insufficient documentation

## 2022-11-08 DIAGNOSIS — F1190 Opioid use, unspecified, uncomplicated: Secondary | ICD-10-CM

## 2022-11-08 DIAGNOSIS — R109 Unspecified abdominal pain: Secondary | ICD-10-CM

## 2022-11-08 DIAGNOSIS — K219 Gastro-esophageal reflux disease without esophagitis: Secondary | ICD-10-CM

## 2022-11-08 LAB — URINALYSIS, ROUTINE W REFLEX MICROSCOPIC
Bilirubin, UA: NEGATIVE
Glucose, UA: NEGATIVE
Ketones, UA: NEGATIVE
Leukocytes,UA: NEGATIVE
Nitrite, UA: NEGATIVE
Protein,UA: NEGATIVE
RBC, UA: NEGATIVE
Specific Gravity, UA: 1.01 (ref 1.005–1.030)
Urobilinogen, Ur: 0.2 mg/dL (ref 0.2–1.0)
pH, UA: 6.5 (ref 5.0–7.5)

## 2022-11-08 MED ORDER — CYCLOBENZAPRINE HCL 5 MG PO TABS: 5.0000 mg | ORAL_TABLET | Freq: Three times a day (TID) | ORAL | 0 refills | Status: DC | PRN

## 2022-11-08 MED ORDER — TAMSULOSIN HCL 0.4 MG PO CAPS: 0.4000 mg | ORAL_CAPSULE | Freq: Every day | ORAL | 0 refills | Status: DC

## 2022-11-08 NOTE — Assessment & Plan Note (Signed)
Followed by pain clinic at [facility].

## 2022-11-08 NOTE — Assessment & Plan Note (Signed)
Refer to depression plan of care. 

## 2022-11-08 NOTE — Progress Notes (Signed)
Contacted via MyChart   Urine is great this check with no further blood!!  Good news.  I would continue with our current plan of care though.:)

## 2022-11-08 NOTE — Assessment & Plan Note (Signed)
Followed by pain clinic at [facility].  Continue this collaboration, recent note reviewed.

## 2022-11-08 NOTE — Assessment & Plan Note (Signed)
Chronic, ongoing.  At this time continue Omeprazole.  Risks of PPI use were discussed with patient including bone loss, C. Diff diarrhea, pneumonia, infections, CKD, electrolyte abnormalities.  Verbalizes understanding and chooses to continue the medication.

## 2022-11-08 NOTE — Assessment & Plan Note (Signed)
Chronic, ongoing.  Denies SI/HI.  Has been resistant to multiple medications in past.  Could consider Trintellix in future if ongoing poor control.  At this time he wishes to remain off medications.  May need return to psychiatry in future.

## 2022-11-08 NOTE — Progress Notes (Signed)
BP 128/81   Pulse 78   Temp 98.3 F (36.8 C) (Oral)   Ht 5' 7.99" (1.727 m)   Wt 176 lb 6.4 oz (80 kg)   SpO2 98%   BMI 26.83 kg/m    Subjective:    Patient ID: William Herring, male    DOB: 1971-08-15, 52 y.o.   MRN: [id number]  HPI: EMERIC NOVINGER is a 52 y.o. male presenting on 11/08/2022 for comprehensive medical examination. Current medical complaints include: kidney stones  He currently lives with: Interim Problems from his last visit: kidney stones  Continues to follow with pain clinic at [facility], last visit 10/03/22.  Follows for sciatic pain lower back.  Continues on Percocet + Gabapentin.  Overall this regimen helps his sciatic pain.  Is having some "kidney" pain and he is worried about this, as is new.  Left side flank pain for a few months -- does not drink alcohol at baseline, but recently drank some and felt like his kidneys were on fire.  History of kidney stones in 20-30's.  Was in ER at [facility] on 11/02/22.  Small bilateral non obstructing renal stones and small stone in the bladder noted on imaging.  UA did noted trace blood.  DEPRESSION Did go to psychiatry briefly after referral 08/05/21, [place].  He stopped attending after provider no showed on a visit.  He reports doing okay without medication.   Mood status: stable Psychotherapy/counseling: none Depressed mood:  baseline, no worsening Anxious mood: occasional Anhedonia: no Significant weight loss or gain: no Insomnia: occasional Fatigue: no Feelings of worthlessness or guilt: no Impaired concentration/indecisiveness: no Suicidal ideations: no Hopelessness: no Crying spells: no    11/08/2022    1:21 PM 08/05/2021    3:55 PM 02/01/2021    4:24 PM 01/01/2020    8:04 AM 09/16/2018    2:30 PM  Depression screen PHQ 2/9  Decreased Interest '2 1 3 2 2  '$ Down, Depressed, Hopeless '2 1 3 '$ 0 3  PHQ - 2 Score '4 2 6 2 5  '$ Altered sleeping 1 0 0 0 1  Tired, decreased energy 2 0 '3 3 3  '$ Change in appetite 1  1 0 0 2  Feeling bad or failure about yourself  1 0 3 0 2  Trouble concentrating 0 0  0 1  Moving slowly or fidgety/restless 1 0 0 0 1  Suicidal thoughts 0 0 0 0 0  PHQ-9 Score '10 3 12 5 15  '$ Difficult doing work/chores Somewhat difficult Somewhat difficult Somewhat difficult Not difficult at all Somewhat difficult       11/08/2022    1:21 PM 08/05/2021    3:56 PM 02/01/2021    4:46 PM 01/01/2020    8:05 AM  GAD 7 : Generalized Anxiety Score  Nervous, Anxious, on Edge '1 1 1 '$ 0  Control/stop worrying 1 0 0 0  Worry too much - different things 1 1 0 0  Trouble relaxing 0 0 0 0  Restless 0 0 0 0  Easily annoyed or irritable '1 1 2 '$ 0  Afraid - awful might happen 1 0 0 0  Total GAD 7 Score '5 3 3 '$ 0  Anxiety Difficulty Somewhat difficult Somewhat difficult Somewhat difficult Not difficult at all   Functional Status Survey: Is the patient deaf or have difficulty hearing?: No Does the patient have difficulty seeing, even when wearing glasses/contacts?: No Does the patient have difficulty concentrating, remembering, or making decisions?: No Does the patient  have difficulty walking or climbing stairs?: No Does the patient have difficulty dressing or bathing?: No Does the patient have difficulty doing errands alone such as visiting a doctor's office or shopping?: No     01/03/2016   11:45 AM 12/04/2017    3:35 PM 01/01/2020    8:03 AM 05/13/2020    2:42 PM 11/08/2022    1:21 PM  [place] in the past year? No No 0  0  Was there an injury with Fall?   0  0  Fall Risk Category Calculator   0  0  Fall Risk Category   Low  Low  Patient Fall Risk Level   Low fall risk Low fall risk   Patient at Risk for Falls Due to     No Fall Risks  Fall risk Follow up   Falls evaluation completed  Falls evaluation completed     Past Medical History:  History reviewed. No pertinent past medical history.  Surgical History:  Past Surgical History:  Procedure Laterality Date   HEMILAMINOTOMY LUMBAR  SPINE     pt states he had this 5 to 6 years ago    HERNIA REPAIR  inguinal   LUMBAR DISC SURGERY     pt states he had surgery 5 or 6 years ago     Medications:  Current Outpatient Medications on File Prior to Visit  Medication Sig   albuterol (VENTOLIN HFA) 108 (90 Base) MCG/ACT inhaler TAKE 2 PUFFS BY MOUTH EVERY 6 HOURS AS NEEDED FOR WHEEZE OR SHORTNESS OF BREATH   gabapentin (NEURONTIN) 600 MG tablet TAKE 1 TABLET BY MOUTH 3 TIMES DAILY   naloxone (NARCAN) nasal spray 4 mg/0.1 mL Place into the nose.   omeprazole (PRILOSEC) 20 MG capsule TAKE 1 CAPSULE BY MOUTH EVERY DAY   oxyCODONE-acetaminophen (PERCOCET) 10-325 MG tablet Take 1 tablet by mouth every 4 (four) hours as needed.   No current facility-administered medications on file prior to visit.    Allergies:  Allergies  Allergen Reactions   Latex Rash    Social History:  Social History   Socioeconomic History   Marital status: Married    Spouse name: Not on file   Number of children: Not on file   Years of education: Not on file   Highest education level: Not on file  Occupational History   Not on file  Tobacco Use   Smoking status: Former   Smokeless tobacco: Former    Types: Nurse, children's Use: Never used  Substance and Sexual Activity   Alcohol use: Yes    Alcohol/week: 0.0 standard drinks of alcohol    Comment: on occasion   Drug use: No   Sexual activity: Yes  Other Topics Concern   Not on file  Social History Narrative   Not on file   Social Determinants of Health   Financial Resource Strain: Not on file  Food Insecurity: Not on file  Transportation Needs: Not on file  Physical Activity: Not on file  Stress: Not on file  Social Connections: Not on file  Intimate Partner Violence: Not on file   Social History   Tobacco Use  Smoking Status Former  Smokeless Tobacco Former   Types: Chew   Social History   Substance and Sexual Activity  Alcohol Use Yes   Alcohol/week: 0.0  standard drinks of alcohol   Comment: on occasion    Family History:  Family History  Problem Relation Age  of Onset   Hypertension Mother    Mental illness Mother    Stroke Paternal Grandmother    Asthma Father     Past medical history, surgical history, medications, allergies, family history and social history reviewed with patient today and changes made to appropriate areas of the chart.   ROS All other ROS negative except what is listed above and in the HPI.      Objective:    BP 128/81   Pulse 78   Temp 98.3 F (36.8 C) (Oral)   Ht 5' 7.99" (1.727 m)   Wt 176 lb 6.4 oz (80 kg)   SpO2 98%   BMI 26.83 kg/m   Wt Readings from Last 3 Encounters:  11/08/22 176 lb 6.4 oz (80 kg)  02/01/21 176 lb 3.2 oz (79.9 kg)  05/13/20 185 lb 3 oz (84 kg)     Physical Exam Vitals and nursing note reviewed.  Constitutional:      General: He is awake. He is not in acute distress.    Appearance: He is well-developed and well-groomed. He is not ill-appearing or toxic-appearing.  HENT:     Head: Normocephalic and atraumatic.     Right Ear: Hearing, tympanic membrane, ear canal and external ear normal. No drainage.     Left Ear: Hearing, tympanic membrane, ear canal and external ear normal. No drainage.     Nose: Nose normal.     Mouth/Throat:     Pharynx: Uvula midline.  Eyes:     General: Lids are normal.        Right eye: No discharge.        Left eye: No discharge.     Extraocular Movements: Extraocular movements intact.     Conjunctiva/sclera: Conjunctivae normal.     Pupils: Pupils are equal, round, and reactive to light.     Visual Fields: Right eye visual fields normal and left eye visual fields normal.  Neck:     Thyroid: No thyromegaly.     Vascular: No carotid bruit or JVD.     Trachea: Trachea normal.  Cardiovascular:     Rate and Rhythm: Normal rate and regular rhythm.     Heart sounds: Normal heart sounds, S1 normal and S2 normal. No murmur heard.    No gallop.   Pulmonary:     Effort: Pulmonary effort is normal. No accessory muscle usage or respiratory distress.     Breath sounds: Normal breath sounds.  Abdominal:     General: Bowel sounds are normal. There is no distension.     Palpations: Abdomen is soft. There is no hepatomegaly.     Tenderness: There is no abdominal tenderness. There is no right CVA tenderness or left CVA tenderness.  Musculoskeletal:        General: Normal range of motion.     Cervical back: Normal range of motion and neck supple.     Right lower leg: No edema.     Left lower leg: No edema.  Lymphadenopathy:     Head:     Right side of head: No submental, submandibular, tonsillar, preauricular or posterior auricular adenopathy.     Left side of head: No submental, submandibular, tonsillar, preauricular or posterior auricular adenopathy.     Cervical: No cervical adenopathy.  Skin:    General: Skin is warm and dry.     Capillary Refill: Capillary refill takes less than 2 seconds.     Findings: No rash.  Neurological:  Mental Status: He is alert and oriented to person, place, and time.     Gait: Gait is intact.     Deep Tendon Reflexes: Reflexes are normal and symmetric.     Reflex Scores:      Brachioradialis reflexes are 2+ on the right side and 2+ on the left side.      Patellar reflexes are 2+ on the right side and 2+ on the left side. Psychiatric:        Attention and Perception: Attention normal.        Mood and Affect: Mood normal.        Speech: Speech normal.        Behavior: Behavior normal. Behavior is cooperative.        Thought Content: Thought content normal.        Cognition and Memory: Cognition normal.     Results for orders placed or performed during the hospital encounter of 05/13/20  Comprehensive metabolic panel  Result Value Ref Range   Sodium 139 135 - 145 mmol/L   Potassium 3.6 3.5 - 5.1 mmol/L   Chloride 96 (L) 98 - 111 mmol/L   CO2 30 22 - 32 mmol/L   Glucose, Bld 144 (H) 70 - 99  mg/dL   BUN 12 6 - 20 mg/dL   Creatinine, Ser 0.74 0.61 - 1.24 mg/dL   Calcium 9.5 8.9 - 10.3 mg/dL   Total Protein 8.4 (H) 6.5 - 8.1 g/dL   Albumin 4.4 3.5 - 5.0 g/dL   AST 75 (H) 15 - 41 U/L   ALT 43 0 - 44 U/L   Alkaline Phosphatase 77 38 - 126 U/L   Total Bilirubin 0.9 0.3 - 1.2 mg/dL   GFR calc non Af Amer >60 >60 mL/min   GFR calc Af Amer >60 >60 mL/min   Anion gap 13 5 - 15  CBC with Differential  Result Value Ref Range   WBC 3.7 (L) 4.0 - 10.5 K/uL   RBC 5.11 4.22 - 5.81 MIL/uL   Hemoglobin 15.5 13.0 - 17.0 g/dL   HCT 41.8 39.0 - 52.0 %   MCV 81.8 80.0 - 100.0 fL   MCH 30.3 26.0 - 34.0 pg   MCHC 37.1 (H) 30.0 - 36.0 g/dL   RDW 12.0 11.5 - 15.5 %   Platelets 222 150 - 400 K/uL   nRBC 0.0 0.0 - 0.2 %   Neutrophils Relative % 70 %   Neutro Abs 2.7 1.7 - 7.7 K/uL   Lymphocytes Relative 22 %   Lymphs Abs 0.8 0.7 - 4.0 K/uL   Monocytes Relative 6 %   Monocytes Absolute 0.2 0.1 - 1.0 K/uL   Eosinophils Relative 0 %   Eosinophils Absolute 0.0 0.0 - 0.5 K/uL   Basophils Relative 1 %   Basophils Absolute 0.0 0.0 - 0.1 K/uL   RBC Morphology MORPHOLOGY UNREMARKABLE    Smear Review Reviewed    Immature Granulocytes 1 %   Abs Immature Granulocytes 0.04 0.00 - 0.07 K/uL   Reactive, Benign Lymphocytes PRESENT   Fibrin derivatives D-Dimer  Result Value Ref Range   Fibrin derivatives D-dimer ([facility]) 1,864.60 (H) 0.00 - 499.00 ng/mL (FEU)  Urinalysis, Complete w Microscopic  Result Value Ref Range   Color, Urine YELLOW (A) YELLOW   APPearance CLEAR (A) CLEAR   Specific Gravity, Urine 1.010 1.005 - 1.030   pH 7.0 5.0 - 8.0   Glucose, UA NEGATIVE NEGATIVE mg/dL   Hgb urine dipstick NEGATIVE NEGATIVE  Bilirubin Urine NEGATIVE NEGATIVE   Ketones, ur 20 (A) NEGATIVE mg/dL   Protein, ur NEGATIVE NEGATIVE mg/dL   Nitrite NEGATIVE NEGATIVE   Leukocytes,Ua NEGATIVE NEGATIVE   WBC, UA 0-5 0 - 5 WBC/hpf   Bacteria, UA NONE SEEN NONE SEEN   Squamous Epithelial / LPF NONE SEEN 0 - 5   Troponin I (High Sensitivity)  Result Value Ref Range   Troponin I (High Sensitivity) 7 <18 ng/L  Troponin I (High Sensitivity)  Result Value Ref Range   Troponin I (High Sensitivity) 7 <18 ng/L      Assessment & Plan:   Problem List Items Addressed This Visit       Digestive   GERD (gastroesophageal reflux disease)    Chronic, ongoing.  At this time continue Omeprazole.  Risks of PPI use were discussed with patient including bone loss, C. Diff diarrhea, pneumonia, infections, CKD, electrolyte abnormalities.  Verbalizes understanding and chooses to continue the medication.       Relevant Orders   Magnesium     Other   Anxiety    Refer to depression plan of care.      Relevant Orders   CBC with Differential/Platelet   TSH   Chronic pain syndrome    Followed by pain clinic at [facility].  Continue this collaboration, recent note reviewed.      Relevant Medications   oxyCODONE-acetaminophen (PERCOCET) 10-325 MG tablet   cyclobenzaprine (FLEXERIL) 5 MG tablet   Other Relevant Orders   CBC with Differential/Platelet   Chronic, continuous use of opioids    Followed by [facility] clinic at [facility]      Depression, major, single episode, severe ([place]) - Primary    Chronic, ongoing.  Denies SI/HI.  Has been resistant to multiple medications in past.  Could consider Trintellix in future if ongoing poor control.  At this time he wishes to remain off medications.  May need return to psychiatry in future.      Relevant Orders   CBC with Differential/Platelet   TSH   Flank pain    Left sided, suspect has been passing kidney stones based on review recent records and symptoms + history.  Will send in Flomax today take for next 30 days and Flexeril as needed for pain.  Return in 4 weeks, if ongoing send to urology.      Other Visit Diagnoses     Benign prostatic hyperplasia without lower urinary tract symptoms       PSA on labs today.   Relevant Medications   tamsulosin (FLOMAX) 0.4 MG  CAPS capsule   Other Relevant Orders   PSA   Urinalysis, Routine w reflex microscopic   Encounter for lipid screening for cardiovascular disease       Lipid panel on labs today.   Relevant Orders   Comprehensive metabolic panel   Lipid Panel w/o Chol/HDL Ratio   Need for hepatitis C screening test       Hep C screen on labs today per guidelines for one time screening, discussed with patient.   Relevant Orders   Hepatitis C antibody   Screening for colon cancer       Referral to GI for colonoscopy.   Relevant Orders   Ambulatory referral to Gastroenterology   Flu vaccine need       Flu vaccine in office today, discussed with patient.   Need for Td vaccine       Td vaccine in office today, discussed with patient.  Encounter for annual physical exam       Annual physical today with labs and health maintenance reviewed, discussed with patient.       Discussed aspirin prophylaxis for myocardial infarction prevention and decision was it was not indicated  LABORATORY TESTING:  Health maintenance labs ordered today as discussed above.   The natural history of prostate cancer and ongoing controversy regarding screening and potential treatment outcomes of prostate cancer has been discussed with the patient. The meaning of a false positive PSA and a false negative PSA has been discussed. He indicates understanding of the limitations of this screening test and wishes to proceed with screening PSA testing.   IMMUNIZATIONS:   - Tdap: Tetanus vaccination status reviewed: refuses. - Influenza: refuses - Pneumovax: Not applicable - Prevnar: Not applicable - Zostavax vaccine: refuses  SCREENING: - Colonoscopy: Ordered today  Discussed with patient purpose of the colonoscopy is to detect colon cancer at curable precancerous or early stages   - AAA Screening: Not applicable  -Hearing Test: Not applicable  -Spirometry: Not applicable   PATIENT COUNSELING:    Sexuality: Discussed  sexually transmitted diseases, partner selection, use of condoms, avoidance of unintended pregnancy  and contraceptive alternatives.   Advised to avoid cigarette smoking.  I discussed with the patient that most people either abstain from alcohol or drink within safe limits (<=14/week and <=4 drinks/occasion for males, <=7/weeks and <= 3 drinks/occasion for females) and that the risk for alcohol disorders and other health effects rises proportionally with the number of drinks per week and how often a drinker exceeds daily limits.  Discussed cessation/primary prevention of drug use and availability of treatment for abuse.   Diet: Encouraged to adjust caloric intake to maintain  or achieve ideal body weight, to reduce intake of dietary saturated fat and total fat, to limit sodium intake by avoiding high sodium foods and not adding table salt, and to maintain adequate dietary potassium and calcium preferably from fresh fruits, vegetables, and low-fat dairy products.    Stressed the importance of regular exercise  Injury prevention: Discussed safety belts, safety helmets, smoke detector, smoking near bedding or upholstery.   Dental health: Discussed importance of regular tooth brushing, flossing, and dental visits.   Follow up plan: NEXT PREVENTATIVE PHYSICAL DUE IN 1 YEAR. Return in about 4 weeks (around 12/06/2022) for Flank Pain Left.

## 2022-11-08 NOTE — Assessment & Plan Note (Signed)
Left sided, suspect has been passing kidney stones based on review recent records and symptoms + history.  Will send in Flomax today take for next 30 days and Flexeril as needed for pain.  Return in 4 weeks, if ongoing send to urology.

## 2022-11-09 ENCOUNTER — Other Ambulatory Visit: Payer: Self-pay

## 2022-11-09 ENCOUNTER — Telehealth: Payer: Self-pay

## 2022-11-09 DIAGNOSIS — Z1211 Encounter for screening for malignant neoplasm of colon: Secondary | ICD-10-CM

## 2022-11-09 LAB — CBC WITH DIFFERENTIAL/PLATELET
Basophils Absolute: 0 10*3/uL (ref 0.0–0.2)
Basos: 1 %
EOS (ABSOLUTE): 0 10*3/uL (ref 0.0–0.4)
Eos: 1 %
Hematocrit: 38.9 % (ref 37.5–51.0)
Hemoglobin: 13.5 g/dL (ref 13.0–17.7)
Immature Grans (Abs): 0 10*3/uL (ref 0.0–0.1)
Immature Granulocytes: 0 %
Lymphocytes Absolute: 2.7 10*3/uL (ref 0.7–3.1)
Lymphs: 49 %
MCH: 31.3 pg (ref 26.6–33.0)
MCHC: 34.7 g/dL (ref 31.5–35.7)
MCV: 90 fL (ref 79–97)
Monocytes Absolute: 0.4 10*3/uL (ref 0.1–0.9)
Monocytes: 6 %
Neutrophils Absolute: 2.3 10*3/uL (ref 1.4–7.0)
Neutrophils: 43 %
Platelets: 211 10*3/uL (ref 150–450)
RBC: 4.31 x10E6/uL (ref 4.14–5.80)
RDW: 12.1 % (ref 11.6–15.4)
WBC: 5.4 10*3/uL (ref 3.4–10.8)

## 2022-11-09 LAB — COMPREHENSIVE METABOLIC PANEL
ALT: 22 IU/L (ref 0–44)
AST: 37 IU/L (ref 0–40)
Albumin/Globulin Ratio: 2.2 (ref 1.2–2.2)
Albumin: 4.4 g/dL (ref 3.8–4.9)
Alkaline Phosphatase: 60 IU/L (ref 44–121)
BUN/Creatinine Ratio: 10 (ref 9–20)
BUN: 8 mg/dL (ref 6–24)
Bilirubin Total: 0.4 mg/dL (ref 0.0–1.2)
CO2: 23 mmol/L (ref 20–29)
Calcium: 9.3 mg/dL (ref 8.7–10.2)
Chloride: 105 mmol/L (ref 96–106)
Creatinine, Ser: 0.79 mg/dL (ref 0.76–1.27)
Globulin, Total: 2 g/dL (ref 1.5–4.5)
Glucose: 97 mg/dL (ref 70–99)
Potassium: 4.2 mmol/L (ref 3.5–5.2)
Sodium: 144 mmol/L (ref 134–144)
Total Protein: 6.4 g/dL (ref 6.0–8.5)
eGFR: 108 mL/min/{1.73_m2} (ref >59)

## 2022-11-09 LAB — LIPID PANEL W/O CHOL/HDL RATIO
Cholesterol, Total: 153 mg/dL (ref 100–199)
HDL: 25 mg/dL — ABNORMAL LOW (ref >39)
LDL Chol Calc (NIH): 85 mg/dL (ref 0–99)
Triglycerides: 254 mg/dL — ABNORMAL HIGH (ref 0–149)
VLDL Cholesterol Cal: 43 mg/dL — ABNORMAL HIGH (ref 5–40)

## 2022-11-09 LAB — HEPATITIS C ANTIBODY: Hep C Virus Ab: NONREACTIVE

## 2022-11-09 LAB — TSH: TSH: 0.665 u[IU]/mL (ref 0.450–4.500)

## 2022-11-09 LAB — MAGNESIUM: Magnesium: 2.3 mg/dL (ref 1.6–2.3)

## 2022-11-09 LAB — PSA: Prostate Specific Ag, Serum: 0.4 ng/mL (ref 0.0–4.0)

## 2022-11-09 NOTE — Telephone Encounter (Signed)
Gastroenterology Pre-Procedure Review  Request Date: 01/01/23 Requesting Physician: Dr. Allen Norris  PATIENT REVIEW QUESTIONS: The patient responded to the following health history questions as indicated:    1. Are you having any GI issues? no 2. Do you have a personal history of Polyps? no 3. Do you have a family history of Colon Cancer or Polyps? no 4. Diabetes Mellitus? no 5. Joint replacements in the past 12 months?no 6. Major health problems in the past 3 months?no 7. Any artificial heart valves, MVP, or defibrillator?no    MEDICATIONS & ALLERGIES:    Patient reports the following regarding taking any anticoagulation/antiplatelet therapy:   Plavix, Coumadin, Eliquis, Xarelto, Lovenox, Pradaxa, Brilinta, or Effient? no Aspirin? no  Patient confirms/reports the following medications:  Current Outpatient Medications  Medication Sig Dispense Refill   albuterol (VENTOLIN HFA) 108 (90 Base) MCG/ACT inhaler TAKE 2 PUFFS BY MOUTH EVERY 6 HOURS AS NEEDED FOR WHEEZE OR SHORTNESS OF BREATH 8.5 g 1   cyclobenzaprine (FLEXERIL) 5 MG tablet Take 1 tablet (5 mg total) by mouth 3 (three) times daily as needed for muscle spasms. 30 tablet 0   gabapentin (NEURONTIN) 600 MG tablet TAKE 1 TABLET BY MOUTH 3 TIMES DAILY 270 tablet 1   naloxone (NARCAN) nasal spray 4 mg/0.1 mL Place into the nose.     omeprazole (PRILOSEC) 20 MG capsule TAKE 1 CAPSULE BY MOUTH EVERY DAY 90 capsule 3   oxyCODONE-acetaminophen (PERCOCET) 10-325 MG tablet Take 1 tablet by mouth every 4 (four) hours as needed.     tamsulosin (FLOMAX) 0.4 MG CAPS capsule Take 1 capsule (0.4 mg total) by mouth daily. 30 capsule 0   No current facility-administered medications for this visit.    Patient confirms/reports the following allergies:  Allergies  Allergen Reactions   Latex Rash    No orders of the defined types were placed in this encounter.   AUTHORIZATION INFORMATION Primary Insurance: 1D#: Group #:  Secondary  Insurance: 1D#: Group #:  SCHEDULE INFORMATION: Date: 01/01/23 Time: Location: [facility]

## 2022-11-09 NOTE — Progress Notes (Signed)
Contacted via [place] evening [place], your labs have returned.  I have good news, they all look great with exception triglycerides.  I recommend adding some daily fish oil (or Omega 3 gummies) to your regimen.  This can help lower levels.  Also focus on diet changes with less red meat -- more chicken and fish.  Any questions? Keep being amazing!!  Thank you for allowing me to participate in your care.  I appreciate you. Kindest regards, Tanea Moga

## 2022-12-02 ENCOUNTER — Other Ambulatory Visit: Payer: Self-pay | Admitting: Nurse Practitioner

## 2022-12-02 NOTE — Patient Instructions (Signed)
Flank Pain, Adult ?Flank pain is pain in your side. The flank is the area on your side between your upper belly (abdomen) and your spine. The pain may occur over a short time (acute), or it may be long-term or come back often (chronic). It may be mild or very bad. Pain in this area can be caused by many different things. ?Follow these instructions at home: ? ?Drink enough fluid to keep your pee (urine) pale yellow. ?Rest as told by your doctor. ?Take over-the-counter and prescription medicines only as told by your doctor. ?Keep a journal to keep track of: ?What has caused your flank pain. ?What has made your flank pain feel better. ?Keep all follow-up visits. ?Contact a doctor if: ?Medicine does not help your pain. ?You have new symptoms. ?Your pain gets worse. ?Your symptoms last longer than 2-3 days. ?You have trouble peeing. ?You are peeing more often than normal. ?Get help right away if: ?You have trouble breathing. ?You are short of breath. ?Your belly hurts, or it is swollen or red. ?You feel like you may vomit (nauseous). ?You vomit. ?You feel faint, or you faint. ?You have blood in your pee. ?You have flank pain and a fever. ?These symptoms may be an emergency. Get help right away. Call your local emergency services (911 in the [place] ?Do not wait to see if the symptoms will go away. ?Do not drive yourself to the hospital. ?Summary ?Flank pain is pain in your side. The flank is the area of your side between your upper belly (abdomen) and your spine. ?Flank pain may occur over a short time (acute), or it may be long-term or come back often (chronic). It may be mild or very bad. ?Pain in this area can be caused by many different things. ?Contact your doctor if your symptoms get worse or last longer than 2-3 days. ?This information is not intended to replace advice given to you by your health care provider. Make sure you discuss any questions you have with your health care provider. ?Document Revised:  01/03/2021 Document Reviewed: 01/03/2021 ?Elsevier Patient Education ? 2023 Elsevier Inc. ? ?

## 2022-12-04 NOTE — Telephone Encounter (Signed)
Requested Prescriptions  Pending Prescriptions Disp Refills   tamsulosin (FLOMAX) 0.4 MG CAPS capsule [Pharmacy Med Name: TAMSULOSIN HCL 0.4 MG CAPSULE] 90 capsule 0    Sig: TAKE 1 CAPSULE BY MOUTH EVERY DAY     Urology: Alpha-Adrenergic Blocker Passed - 12/02/2022 10:33 AM      Passed - PSA in normal range and within 360 days    Prostate Specific Ag, Serum  Date Value Ref Range Status  11/08/2022 0.4 0.0 - 4.0 ng/mL Final    Comment:    Roche ECLIA methodology. According to the American Urological Association, Serum PSA should decrease and remain at undetectable levels after radical prostatectomy. The AUA defines biochemical recurrence as an initial PSA value 0.2 ng/mL or greater followed by a subsequent confirmatory PSA value 0.2 ng/mL or greater. Values obtained with different assay methods or kits cannot be used interchangeably. Results cannot be interpreted as absolute evidence of the presence or absence of malignant disease.          Passed - Last BP in normal range    BP Readings from Last 1 Encounters:  11/08/22 128/81         Passed - Valid encounter within last 12 months    Recent Outpatient Visits           3 weeks ago Depression, major, single episode, severe ([place])   [place], Henrine Screws T, NP   1 year ago Depression, major, single episode, severe ([place])   [place], Henrine Screws T, NP   1 year ago Depression, major, single episode, severe ([place])   [place], Henrine Screws T, NP   2 years ago Pneumonia due to COVID-19 virus   [place], Henrine Screws T, NP   2 years ago Lab test positive for detection of COVID-19 virus   [place], Barbaraann Faster, NP       Future Appointments             In 2 days Venita Lick, NP [place], [facility]

## 2022-12-06 ENCOUNTER — Ambulatory Visit (INDEPENDENT_AMBULATORY_CARE_PROVIDER_SITE_OTHER): Payer: BC Managed Care – PPO | Admitting: Nurse Practitioner

## 2022-12-06 ENCOUNTER — Encounter: Payer: Self-pay | Admitting: Nurse Practitioner

## 2022-12-06 VITALS — BP 125/84 | HR 80 | Temp 98.0°F | Ht 67.99 in | Wt 182.6 lb

## 2022-12-06 DIAGNOSIS — R109 Unspecified abdominal pain: Secondary | ICD-10-CM | POA: Diagnosis not present

## 2022-12-06 NOTE — Progress Notes (Signed)
BP 125/84   Pulse 80   Temp 98 F (36.7 C) (Oral)   Ht 5' 7.99" (1.727 m)   Wt 182 lb 9.6 oz (82.8 kg)   SpO2 97%   BMI 27.77 kg/m    Subjective:    Patient ID: William Herring, male    DOB: May 05, 1971, 52 y.o.   MRN: [id number]  HPI: William Herring is a 52 y.o. male  Chief Complaint  Patient presents with   Left Flank Pain    Follow up. Patient states that he is still having pain   FLANK PAIN Follow-up for flank pain today, started on Flomax and Flexeril last visit.  Left side flank pain for a few months -- does not drink alcohol at baseline, but recently drank some and felt like his kidneys were on fire. History of kidney stones in 20-30's. Was in ER at [facility] on 11/02/22. Small bilateral non obstructing renal stones and small stone in the bladder noted on imaging. UA no blood.  He reports ongoing discomfort, he feels Flomax is working as urinating better.   Duration: months Mechanism of injury: unknown Location: left flank Onset: sudden Severity: 5/10 - Flexeril not helping Quality: dull, aching, and throbbing Frequency: intermittent at times more constant Radiation: none Aggravating factors: movement and bending Alleviating factors: nothing Status: fluctuating Treatments attempted: Flexeril, Flomax, Percocet Relief with NSAIDs?: No NSAIDs Taken Nighttime pain:  no Paresthesias / decreased sensation:  no Bowel / bladder incontinence:  no Fevers:  no Dysuria / urinary frequency:  no   Relevant past medical, surgical, family and social history reviewed and updated as indicated. Interim medical history since our last visit reviewed. Allergies and medications reviewed and updated.  Review of Systems  Constitutional:  Negative for activity change, diaphoresis, fatigue and fever.  Respiratory:  Negative for cough, chest tightness, shortness of breath and wheezing.   Cardiovascular:  Negative for chest pain, palpitations and leg swelling.  Gastrointestinal:  Negative.   Musculoskeletal:  Positive for back pain.  Neurological: Negative.   Psychiatric/Behavioral:  Negative for decreased concentration, self-injury, sleep disturbance and suicidal ideas. The patient is not nervous/anxious.     Per HPI unless specifically indicated above     Objective:    BP 125/84   Pulse 80   Temp 98 F (36.7 C) (Oral)   Ht 5' 7.99" (1.727 m)   Wt 182 lb 9.6 oz (82.8 kg)   SpO2 97%   BMI 27.77 kg/m   Wt Readings from Last 3 Encounters:  12/06/22 182 lb 9.6 oz (82.8 kg)  11/08/22 176 lb 6.4 oz (80 kg)  02/01/21 176 lb 3.2 oz (79.9 kg)    Physical Exam Vitals and nursing note reviewed.  Constitutional:      General: He is awake. He is not in acute distress.    Appearance: He is well-developed and well-groomed. He is not ill-appearing or toxic-appearing.  HENT:     Head: Normocephalic.     Right Ear: Hearing and external ear normal.     Left Ear: Hearing and external ear normal.  Eyes:     General: Lids are normal.     Extraocular Movements: Extraocular movements intact.     Conjunctiva/sclera: Conjunctivae normal.  Neck:     Thyroid: No thyromegaly.     Vascular: No carotid bruit.  Cardiovascular:     Rate and Rhythm: Normal rate and regular rhythm.     Heart sounds: Normal heart sounds.  Pulmonary:  Effort: No accessory muscle usage or respiratory distress.     Breath sounds: Normal breath sounds.  Abdominal:     General: Bowel sounds are normal. There is no distension.     Palpations: Abdomen is soft.     Tenderness: There is no abdominal tenderness.  Musculoskeletal:     Cervical back: Full passive range of motion without pain.       Back:     Right lower leg: No edema.     Left lower leg: No edema.  Lymphadenopathy:     Cervical: No cervical adenopathy.  Skin:    General: Skin is warm.     Capillary Refill: Capillary refill takes less than 2 seconds.  Neurological:     Mental Status: He is alert and oriented to person,  place, and time.     Deep Tendon Reflexes: Reflexes are normal and symmetric.     Reflex Scores:      Brachioradialis reflexes are 2+ on the right side and 2+ on the left side.      Patellar reflexes are 2+ on the right side and 2+ on the left side. Psychiatric:        Attention and Perception: Attention normal.        Mood and Affect: Mood normal.        Speech: Speech normal.        Behavior: Behavior normal. Behavior is cooperative.        Thought Content: Thought content normal.    Results for orders placed or performed in visit on 11/08/22  CBC with Differential/Platelet  Result Value Ref Range   WBC 5.4 3.4 - 10.8 x10E3/uL   RBC 4.31 4.14 - 5.80 x10E6/uL   Hemoglobin 13.5 13.0 - 17.7 g/dL   Hematocrit 38.9 37.5 - 51.0 %   MCV 90 79 - 97 fL   MCH 31.3 26.6 - 33.0 pg   MCHC 34.7 31.5 - 35.7 g/dL   RDW 12.1 11.6 - 15.4 %   Platelets 211 150 - 450 x10E3/uL   Neutrophils 43 Not Estab. %   Lymphs 49 Not Estab. %   Monocytes 6 Not Estab. %   Eos 1 Not Estab. %   Basos 1 Not Estab. %   Neutrophils Absolute 2.3 1.4 - 7.0 x10E3/uL   Lymphocytes Absolute 2.7 0.7 - 3.1 x10E3/uL   Monocytes Absolute 0.4 0.1 - 0.9 x10E3/uL   EOS (ABSOLUTE) 0.0 0.0 - 0.4 x10E3/uL   Basophils Absolute 0.0 0.0 - 0.2 x10E3/uL   Immature Granulocytes 0 Not Estab. %   Immature Grans (Abs) 0.0 0.0 - 0.1 x10E3/uL  Comprehensive metabolic panel  Result Value Ref Range   Glucose 97 70 - 99 mg/dL   BUN 8 6 - 24 mg/dL   Creatinine, Ser 0.79 0.76 - 1.27 mg/dL   eGFR 108 >59 mL/min/1.73   BUN/Creatinine Ratio 10 9 - 20   Sodium 144 134 - 144 mmol/L   Potassium 4.2 3.5 - 5.2 mmol/L   Chloride 105 96 - 106 mmol/L   CO2 23 20 - 29 mmol/L   Calcium 9.3 8.7 - 10.2 mg/dL   Total Protein 6.4 6.0 - 8.5 g/dL   Albumin 4.4 3.8 - 4.9 g/dL   Globulin, Total 2.0 1.5 - 4.5 g/dL   Albumin/Globulin Ratio 2.2 1.2 - 2.2   Bilirubin Total 0.4 0.0 - 1.2 mg/dL   Alkaline Phosphatase 60 44 - 121 IU/L   AST 37 0 - 40 IU/L  ALT 22 0 - 44 IU/L  Lipid Panel w/o Chol/HDL Ratio  Result Value Ref Range   Cholesterol, Total 153 100 - 199 mg/dL   Triglycerides 254 (H) 0 - 149 mg/dL   HDL 25 (L) >39 mg/dL   VLDL Cholesterol Cal 43 (H) 5 - 40 mg/dL   LDL Chol Calc (NIH) 85 0 - 99 mg/dL  TSH  Result Value Ref Range   TSH 0.665 0.450 - 4.500 uIU/mL  PSA  Result Value Ref Range   Prostate Specific Ag, Serum 0.4 0.0 - 4.0 ng/mL  Magnesium  Result Value Ref Range   Magnesium 2.3 1.6 - 2.3 mg/dL  Hepatitis C antibody  Result Value Ref Range   Hep C Virus Ab Non Reactive Non Reactive  Urinalysis, Routine w reflex microscopic  Result Value Ref Range   Specific Gravity, UA 1.010 1.005 - 1.030   pH, UA 6.5 5.0 - 7.5   Color, UA Yellow Yellow   Appearance Ur Clear Clear   Leukocytes,UA Negative Negative   Protein,UA Negative Negative/Trace   Glucose, UA Negative Negative   Ketones, UA Negative Negative   RBC, UA Negative Negative   Bilirubin, UA Negative Negative   Urobilinogen, Ur 0.2 0.2 - 1.0 mg/dL   Nitrite, UA Negative Negative   Microscopic Examination Comment       Assessment & Plan:   Problem List Items Addressed This Visit       Other   Flank pain - Primary    Left sided, suspect has been passing kidney stones based on review recent records and symptoms + history, however consideration for back pain (SI) as well since symptoms ongoing and noted more with movement.  Continue Flomax for now and Flexeril as needed for pain.  He does not want to go to urology at this time, may consider in future after seeing back provider.        Follow up plan: Return in about 3 months (around 03/06/2023) for [facility] PAIN.

## 2022-12-06 NOTE — Assessment & Plan Note (Signed)
Left sided, suspect has been passing kidney stones based on review recent records and symptoms + history, however consideration for back pain (SI) as well since symptoms ongoing and noted more with movement.  Continue Flomax for now and Flexeril as needed for pain.  He does not want to go to urology at this time, may consider in future after seeing back provider.

## 2022-12-19 DIAGNOSIS — G894 Chronic pain syndrome: Secondary | ICD-10-CM | POA: Diagnosis not present

## 2022-12-19 DIAGNOSIS — M5416 Radiculopathy, lumbar region: Secondary | ICD-10-CM | POA: Diagnosis not present

## 2022-12-19 DIAGNOSIS — M961 Postlaminectomy syndrome, not elsewhere classified: Secondary | ICD-10-CM | POA: Diagnosis not present

## 2022-12-19 DIAGNOSIS — Z79891 Long term (current) use of opiate analgesic: Secondary | ICD-10-CM | POA: Diagnosis not present

## 2022-12-27 ENCOUNTER — Encounter: Payer: Self-pay | Admitting: Gastroenterology

## 2022-12-29 ENCOUNTER — Other Ambulatory Visit: Payer: Self-pay

## 2022-12-29 MED ORDER — NA SULFATE-K SULFATE-MG SULF 17.5-3.13-1.6 GM/177ML PO SOLN: 1.0000 | Freq: Once | ORAL | 0 refills | Status: AC

## 2022-12-29 NOTE — Progress Notes (Signed)
Patient did not receive his rx, nor his instructions.  Contacted him to let him know his rx was sent to pharmacy and instructions sent via mychart.  Thanks, [place], [place]

## 2023-01-01 ENCOUNTER — Encounter: Payer: Self-pay | Admitting: Gastroenterology

## 2023-01-01 ENCOUNTER — Encounter
Admission: RE | Disposition: A | Discharge status: Home / Self Care | Payer: Self-pay | Source: Home / Self Care | Attending: Gastroenterology

## 2023-01-01 ENCOUNTER — Ambulatory Visit: Payer: BC Managed Care – PPO | Admitting: Anesthesiology

## 2023-01-01 ENCOUNTER — Other Ambulatory Visit: Payer: Self-pay

## 2023-01-01 ENCOUNTER — Ambulatory Visit
Admission: RE | Admit: 2023-01-01 | Discharge: 2023-01-01 | Disposition: A | Discharge status: Home / Self Care | Payer: BC Managed Care – PPO | Attending: Gastroenterology | Admitting: Gastroenterology

## 2023-01-01 DIAGNOSIS — G894 Chronic pain syndrome: Secondary | ICD-10-CM | POA: Diagnosis not present

## 2023-01-01 DIAGNOSIS — Z87891 Personal history of nicotine dependence: Secondary | ICD-10-CM | POA: Insufficient documentation

## 2023-01-01 DIAGNOSIS — K635 Polyp of colon: Secondary | ICD-10-CM | POA: Diagnosis not present

## 2023-01-01 DIAGNOSIS — D128 Benign neoplasm of rectum: Secondary | ICD-10-CM | POA: Insufficient documentation

## 2023-01-01 DIAGNOSIS — Z1211 Encounter for screening for malignant neoplasm of colon: Secondary | ICD-10-CM | POA: Diagnosis not present

## 2023-01-01 DIAGNOSIS — F418 Other specified anxiety disorders: Secondary | ICD-10-CM | POA: Diagnosis not present

## 2023-01-01 DIAGNOSIS — Z09 Encounter for follow-up examination after completed treatment for conditions other than malignant neoplasm: Secondary | ICD-10-CM | POA: Insufficient documentation

## 2023-01-01 DIAGNOSIS — K640 First degree hemorrhoids: Secondary | ICD-10-CM | POA: Diagnosis not present

## 2023-01-01 DIAGNOSIS — K219 Gastro-esophageal reflux disease without esophagitis: Secondary | ICD-10-CM | POA: Diagnosis not present

## 2023-01-01 HISTORY — PX: POLYPECTOMY: SHX5525

## 2023-01-01 HISTORY — DX: Gastro-esophageal reflux disease without esophagitis: K21.9

## 2023-01-01 HISTORY — PX: COLONOSCOPY WITH PROPOFOL: SHX5780

## 2023-01-01 HISTORY — DX: Personal history of urinary calculi: Z87.442

## 2023-01-01 HISTORY — DX: Chronic pain syndrome: G89.4

## 2023-01-01 SURGERY — COLONOSCOPY WITH PROPOFOL
Anesthesia: General | Site: Rectum

## 2023-01-01 MED ORDER — PROPOFOL 10 MG/ML IV BOLUS
INTRAVENOUS | Status: DC | PRN
  Administered 2023-01-01: 50 mg via INTRAVENOUS
  Administered 2023-01-01: 30 mg via INTRAVENOUS
  Administered 2023-01-01: 70 mg via INTRAVENOUS
  Administered 2023-01-01: 80 mg via INTRAVENOUS
  Administered 2023-01-01: 30 mg via INTRAVENOUS
  Administered 2023-01-01 (×2): 20 mg via INTRAVENOUS

## 2023-01-01 MED ORDER — LACTATED RINGERS IV SOLN: INTRAVENOUS | Status: DC

## 2023-01-01 MED ORDER — STERILE WATER FOR IRRIGATION IR SOLN
Status: DC | PRN
  Administered 2023-01-01: 50 mL

## 2023-01-01 MED ORDER — SODIUM CHLORIDE 0.9 % IV SOLN: INTRAVENOUS | Status: DC

## 2023-01-01 MED ORDER — LIDOCAINE HCL (CARDIAC) PF 100 MG/5ML IV SOSY
PREFILLED_SYRINGE | INTRAVENOUS | Status: DC | PRN
  Administered 2023-01-01: 40 mg via INTRAVENOUS

## 2023-01-01 SURGICAL SUPPLY — 7 items
GOWN CVR UNV OPN BCK APRN NK (MISCELLANEOUS) ×2 IMPLANT
GOWN ISOL THUMB LOOP REG UNIV (MISCELLANEOUS) ×2
KIT PRC NS LF DISP ENDO (KITS) ×1 IMPLANT
KIT PROCEDURE OLYMPUS (KITS) ×1
MANIFOLD NEPTUNE II (INSTRUMENTS) ×1 IMPLANT
SNARE COLD EXACTO (MISCELLANEOUS) IMPLANT
TRAP ETRAP POLY (MISCELLANEOUS) IMPLANT

## 2023-01-01 NOTE — H&P (Signed)
Lucilla Lame, MD [facility] [address], [place], [place] [address] Phone: [phone] Fax : [phone]  Primary Care Physician:  Venita Lick, NP Primary Gastroenterologist:  Dr. Allen Norris  Pre-Procedure History & Physical: HPI:  William Herring is a 52 y.o. male is here for a screening colonoscopy.   Past Medical History:  Diagnosis Date   Chronic pain syndrome    GERD (gastroesophageal reflux disease)    History of kidney stones     Past Surgical History:  Procedure Laterality Date   HEMILAMINOTOMY LUMBAR SPINE     pt states he had this 5 to 6 years ago    HERNIA REPAIR  inguinal   LUMBAR [place]     pt states he had surgery 5 or 6 years ago     Prior to Admission medications   Medication Sig Start Date End Date Taking? Authorizing Provider  gabapentin (NEURONTIN) 600 MG tablet TAKE 1 TABLET BY MOUTH 3 TIMES DAILY 02/18/18  Yes Kathrine Haddock, NP  Multiple Vitamins-Minerals (CENTRUM MEN PO) Take by mouth daily.   Yes [provider]  omeprazole (PRILOSEC) 20 MG capsule TAKE 1 CAPSULE BY MOUTH EVERY DAY 09/15/21  Yes Cannady, Jolene T, NP  oxyCODONE-acetaminophen (PERCOCET) 10-325 MG tablet Take 1 tablet by mouth every 4 (four) hours as needed. 11/01/22  Yes [provider]  tamsulosin (FLOMAX) 0.4 MG CAPS capsule TAKE 1 CAPSULE BY MOUTH EVERY DAY 12/04/22  Yes Cannady, Jolene T, NP  cyclobenzaprine (FLEXERIL) 5 MG tablet Take 1 tablet (5 mg total) by mouth 3 (three) times daily as needed for muscle spasms. Patient not taking: Reported on 12/27/2022 11/08/22   Marnee Guarneri T, NP    Allergies as of 11/09/2022 - Review Complete 11/09/2022  Allergen Reaction Noted   Latex Rash 04/21/2015    Family History  Problem Relation Age of Onset   Hypertension Mother    Mental illness Mother    Stroke Paternal Grandmother    Asthma Father     Social History   Socioeconomic History   Marital status: Married    Spouse name: Not on file    Number of children: Not on file   Years of education: Not on file   Highest education level: Not on file  Occupational History   Not on file  Tobacco Use   Smoking status: Former    Types: Cigarettes    Quit date: 2010    Years since quitting: 14.1   Smokeless tobacco: Former    Types: Nurse, children's Use: Never used  Substance and Sexual Activity   Alcohol use: Not Currently    Comment: on occasion   Drug use: No   Sexual activity: Yes  Other Topics Concern   Not on file  Social History Narrative   Not on file   Social Determinants of Health   Financial Resource Strain: Not on file  Food Insecurity: Not on file  Transportation Needs: Not on file  Physical Activity: Not on file  Stress: Not on file  Social Connections: Not on file  Intimate Partner Violence: Not on file    Review of Systems: See HPI, otherwise negative ROS  Physical Exam: BP (!) 142/96   Pulse 96   Temp 98.9 F (37.2 C) (Temporal)   Resp 10   Ht '5\' 6"'$  (1.676 m)   Wt 78 kg   SpO2 96%   BMI 27.76 kg/m  General:   Alert,  pleasant  and cooperative in NAD Head:  Normocephalic and atraumatic. Neck:  Supple; no masses or thyromegaly. Lungs:  Clear throughout to auscultation.    Heart:  Regular rate and rhythm. Abdomen:  Soft, nontender and nondistended. Normal bowel sounds, without guarding, and without rebound.   Neurologic:  Alert and  oriented x4;  grossly normal neurologically.  Impression/Plan: William Herring is now here to undergo a screening colonoscopy.  Risks, benefits, and alternatives regarding colonoscopy have been reviewed with the patient.  Questions have been answered.  All parties agreeable.

## 2023-01-01 NOTE — Transfer of Care (Signed)
Immediate Anesthesia Transfer of Care Note  Patient: William Herring  Procedure(s) Performed: COLONOSCOPY WITH BIOPSY (Rectum) POLYPECTOMY (Rectum)  Patient Location: PACU  Anesthesia Type: General  Level of Consciousness: awake, alert  and patient cooperative  Airway and Oxygen Therapy: Patient Spontanous Breathing and Patient connected to supplemental oxygen  Post-op Assessment: Post-op Vital signs reviewed, Patient's Cardiovascular Status Stable, Respiratory Function Stable, Patent Airway and No signs of Nausea or vomiting  Post-op Vital Signs: Reviewed and stable  Complications: No notable events documented.

## 2023-01-01 NOTE — Anesthesia Postprocedure Evaluation (Signed)
Anesthesia Post Note  Patient: William Herring  Procedure(s) Performed: COLONOSCOPY WITH BIOPSY (Rectum) POLYPECTOMY (Rectum)  Patient location during evaluation: PACU Anesthesia Type: General Level of consciousness: awake and alert Pain management: pain level controlled Vital Signs Assessment: post-procedure vital signs reviewed and stable Respiratory status: spontaneous breathing, nonlabored ventilation, respiratory function stable and patient connected to nasal cannula oxygen Cardiovascular status: blood pressure returned to baseline and stable Postop Assessment: no apparent nausea or vomiting Anesthetic complications: no   No notable events documented.   Last Vitals:  Vitals:   01/01/23 0706 01/01/23 0815  BP: (!) 142/96 118/85  Pulse: 96 65  Resp: 10 13  Temp: 37.2 C 36.9 C  SpO2: 96% 95%    Last Pain:  Vitals:   01/01/23 0815  TempSrc:   PainSc: 0-No pain                 Molli Barrows

## 2023-01-01 NOTE — Op Note (Signed)
[facility] Patient Name: William Herring Procedure Date: 01/01/2023 7:30 AM MRN: [id number] Account #: [id number] Date of Birth: Sep 10, 1971 Admit Type: Outpatient Age: 52 Room: [facility] OR ROOM 01 Gender: Male Note Status: Finalized Instrument Name: [id number] Procedure:             Colonoscopy Indications:           Screening for colorectal malignant neoplasm Providers:             Lucilla Lame MD, MD Referring MD:          Barbaraann Faster. Cannady (Referring MD) Medicines:             Propofol per Anesthesia Complications:         No immediate complications. Procedure:             Pre-Anesthesia Assessment:                        - Prior to the procedure, a History and Physical was                         performed, and patient medications and allergies were                         reviewed. The patient's tolerance of previous                         anesthesia was also reviewed. The risks and benefits                         of the procedure and the sedation options and risks                         were discussed with the patient. All questions were                         answered, and informed consent was obtained. Prior                         Anticoagulants: The patient has taken no anticoagulant                         or antiplatelet agents. ASA Grade Assessment: II - A                         patient with mild systemic disease. After reviewing                         the risks and benefits, the patient was deemed in                         satisfactory condition to undergo the procedure.                        After obtaining informed consent, the colonoscope was                         passed under direct vision. Throughout the procedure,  the patient's blood pressure, pulse, and oxygen                         saturations were monitored continuously. The                         Colonoscope was introduced through the anus and                          advanced to the the cecum, identified by appendiceal                         orifice and ileocecal valve. The colonoscopy was                         performed without difficulty. The patient tolerated                         the procedure well. The quality of the bowel                         preparation was excellent. Findings:      The perianal and digital rectal examinations were normal.      A 4 mm polyp was found in the rectum. The polyp was sessile. The polyp       was removed with a cold snare. Resection and retrieval were complete.      Non-bleeding internal hemorrhoids were found during retroflexion. The       hemorrhoids were Grade I (internal hemorrhoids that do not prolapse). Impression:            - One 4 mm polyp in the rectum, removed with a cold                         snare. Resected and retrieved.                        - Non-bleeding internal hemorrhoids. Recommendation:        - Discharge patient to home.                        - Resume previous diet.                        - Continue present medications.                        - Await pathology results.                        - If the pathology report reveals adenomatous tissue,                         then repeat the colonoscopy for surveillance in 7                         years. Procedure Code(s):     --- Professional ---                        [id number], Colonoscopy, flexible; with removal of  tumor(s), polyp(s), or other lesion(s) by snare                         technique Diagnosis Code(s):     --- Professional ---                        Z12.11, Encounter for screening for malignant neoplasm                         of colon                        D12.8, Benign neoplasm of rectum CPT copyright 2022 American Medical Association. All rights reserved. The codes documented in this report are preliminary and upon coder review may  be revised to meet current compliance  requirements. Lucilla Lame MD, MD 01/01/2023 8:10:49 AM This report has been signed electronically. Number of Addenda: 0 Note Initiated On: 01/01/2023 7:30 AM Scope Withdrawal Time: 0 hours 11 minutes 49 seconds  Total Procedure Duration: 0 hours 16 minutes 17 seconds  Estimated Blood Loss:  Estimated blood loss: none.      [facility]

## 2023-01-01 NOTE — Anesthesia Preprocedure Evaluation (Signed)
Anesthesia Evaluation  Patient identified by MRN, date of birth, ID band Patient awake    Reviewed: Allergy & Precautions, H&P , NPO status , Patient's Chart, lab work & pertinent test results, reviewed documented beta blocker date and time   Airway Mallampati: II   Neck ROM: full    Dental  (+) Poor Dentition   Pulmonary neg pulmonary ROS, former smoker   Pulmonary exam normal        Cardiovascular negative cardio ROS Normal cardiovascular exam Rhythm:regular Rate:Normal     Neuro/Psych  PSYCHIATRIC DISORDERS Anxiety Depression     Neuromuscular disease    GI/Hepatic Neg liver ROS,GERD  Medicated,,  Endo/Other  negative endocrine ROS    Renal/GU negative Renal ROS  negative genitourinary   Musculoskeletal   Abdominal   Peds  Hematology negative hematology ROS (+)   Anesthesia Other Findings Past Medical History: No date: Chronic pain syndrome No date: GERD (gastroesophageal reflux disease) No date: History of kidney stones Past Surgical History: No date: HEMILAMINOTOMY LUMBAR SPINE     Comment:  pt states he had this 5 to 6 years ago  inguinal: HERNIA REPAIR No date: LUMBAR DISC SURGERY     Comment:  pt states he had surgery 5 or 6 years ago  BMI    Body Mass Index: 27.76 kg/m     Reproductive/Obstetrics negative OB ROS                             Anesthesia Physical Anesthesia Plan  ASA: 2  Anesthesia Plan: General   Post-op Pain Management:    Induction:   PONV Risk Score and Plan:   Airway Management Planned:   Additional Equipment:   Intra-op Plan:   Post-operative Plan:   Informed Consent: I have reviewed the patients History and Physical, chart, labs and discussed the procedure including the risks, benefits and alternatives for the proposed anesthesia with the patient or authorized representative who has indicated his/her understanding and acceptance.      Dental Advisory Given  Plan Discussed with: CRNA  Anesthesia Plan Comments:        Anesthesia Quick Evaluation

## 2023-01-02 ENCOUNTER — Encounter: Payer: Self-pay | Admitting: Gastroenterology

## 2023-01-02 LAB — SURGICAL PATHOLOGY

## 2023-01-06 ENCOUNTER — Encounter: Payer: Self-pay | Admitting: Gastroenterology

## 2023-03-04 NOTE — Patient Instructions (Signed)
Flank Pain, Adult Flank pain is pain in your side. The flank is the area on your side between your upper belly (abdomen) and your spine. The pain may occur over a short time (acute), or it may be long-term or come back often (chronic). It may be mild or very bad. Pain in this area can be caused by many different things. Follow these instructions at home:  Drink enough fluid to keep your pee (urine) pale yellow. Rest as told by your doctor. Take over-the-counter and prescription medicines only as told by your doctor. Keep a journal to keep track of: What has caused your flank pain. What has made your flank pain feel better. Keep all follow-up visits. Contact a doctor if: Medicine does not help your pain. You have new symptoms. Your pain gets worse. Your symptoms last longer than 2-3 days. You have trouble peeing. You are peeing more often than normal. Get help right away if: You have trouble breathing. You are short of breath. Your belly hurts, or it is swollen or red. You feel like you may vomit (nauseous). You vomit. You feel faint, or you faint. You have blood in your pee. You have flank pain and a fever. These symptoms may be an emergency. Get help right away. Call your local emergency services (911 in the [place] Do not wait to see if the symptoms will go away. Do not drive yourself to the hospital. Summary Flank pain is pain in your side. The flank is the area of your side between your upper belly (abdomen) and your spine. Flank pain may occur over a short time (acute), or it may be long-term or come back often (chronic). It may be mild or very bad. Pain in this area can be caused by many different things. Contact your doctor if your symptoms get worse or last longer than 2-3 days. This information is not intended to replace advice given to you by your health care provider. Make sure you discuss any questions you have with your health care provider. Document Revised:  01/03/2021 Document Reviewed: 01/03/2021 Elsevier Patient Education  2023 [employer]vier Inc.  

## 2023-03-06 ENCOUNTER — Encounter: Payer: Self-pay | Admitting: Nurse Practitioner

## 2023-03-06 ENCOUNTER — Ambulatory Visit: Payer: BC Managed Care – PPO | Admitting: Nurse Practitioner

## 2023-03-06 VITALS — BP 127/87 | HR 64 | Temp 98.0°F | Ht 67.99 in | Wt 180.5 lb

## 2023-03-06 DIAGNOSIS — F1190 Opioid use, unspecified, uncomplicated: Secondary | ICD-10-CM

## 2023-03-06 DIAGNOSIS — Z87442 Personal history of urinary calculi: Secondary | ICD-10-CM

## 2023-03-06 DIAGNOSIS — F5104 Psychophysiologic insomnia: Secondary | ICD-10-CM | POA: Diagnosis not present

## 2023-03-06 DIAGNOSIS — G894 Chronic pain syndrome: Secondary | ICD-10-CM

## 2023-03-06 DIAGNOSIS — M5416 Radiculopathy, lumbar region: Secondary | ICD-10-CM | POA: Diagnosis not present

## 2023-03-06 DIAGNOSIS — M961 Postlaminectomy syndrome, not elsewhere classified: Secondary | ICD-10-CM | POA: Diagnosis not present

## 2023-03-06 DIAGNOSIS — G4700 Insomnia, unspecified: Secondary | ICD-10-CM | POA: Insufficient documentation

## 2023-03-06 DIAGNOSIS — Z79891 Long term (current) use of opiate analgesic: Secondary | ICD-10-CM | POA: Diagnosis not present

## 2023-03-06 MED ORDER — CYCLOBENZAPRINE HCL 5 MG PO TABS: 5.0000 mg | ORAL_TABLET | Freq: Three times a day (TID) | ORAL | 3 refills | Status: DC | PRN

## 2023-03-06 MED ORDER — TAMSULOSIN HCL 0.4 MG PO CAPS: 0.4000 mg | ORAL_CAPSULE | Freq: Every day | ORAL | 4 refills | Status: DC

## 2023-03-06 MED ORDER — TRAZODONE HCL 150 MG PO TABS: 150.0000 mg | ORAL_TABLET | Freq: Every day | ORAL | 4 refills | Status: DC

## 2023-03-06 NOTE — Assessment & Plan Note (Signed)
Followed by pain clinic at [facility].  Continue this collaboration, recent note reviewed. 

## 2023-03-06 NOTE — Assessment & Plan Note (Signed)
Followed by pain clinic at [facility]. 

## 2023-03-06 NOTE — Assessment & Plan Note (Signed)
Chronic, ongoing.  Has underlying chronic pain.  Refills on Trazodone sent.  Recommend focus on sleep hygiene methods and: ?Maintain a regular sleep schedule, particularly a regular wake-up time in the morning ?Try not to force sleep ?Avoid caffeinated beverages after lunch ?Avoid alcohol near bedtime (eg, late afternoon and evening)  ?Avoid smoking or other nicotine intake, particularly during the evening ?Adjust the bedroom environment as needed to decrease stimuli (eg, reduce ambient light, turn off the television or radio) ?Avoid prolonged use of light-emitting screens (laptops, tablets, smartphones, ebooks) before bedtime  ?Resolve concerns or worries before bedtime ?Exercise regularly for at least 20 minutes, preferably more than four to five hours prior to bedtime  ?Avoid daytime naps, especially if they are longer than 20 to 30 minutes or occur late in the day

## 2023-03-06 NOTE — Assessment & Plan Note (Signed)
Improved at this time, will continue Flomax and Flexeril at this time as offering benefit and history of kidney stones.  Send to urology as needed.

## 2023-03-06 NOTE — Progress Notes (Signed)
BP 127/87   Pulse 64   Temp 98 F (36.7 C) (Oral)   Ht 5' 7.99" (1.727 m)   Wt 180 lb 8 oz (81.9 kg)   SpO2 95%   BMI 27.45 kg/m    Subjective:    Patient ID: William Herring, male    DOB: 1971-05-14, 52 y.o.   MRN: [id number]  HPI: William Herring is a 52 y.o. male  Chief Complaint  Patient presents with   Flank Pain    Patient states that he is still having pain   CHRONIC PAIN Follow-up for flank pain today, this has improved -- current pain is sciatic.  Left side flank pain for a few months. History of kidney stones in 20-30's. Was in ER at [facility] on 11/02/22. Small bilateral non obstructing renal stones and small stone in the bladder noted on imaging. UA no blood. Continues to use Flomax.  Currently having pain, but this not flank pain -- this is sciatic pain, to back of left knee.  Needs refills on Trazodone.  Sees pain management this afternoon, has done all pain management techniques including injections.   Duration: months Mechanism of injury: unknown Location: left flank Onset: sudden Severity: 3/10 today  Quality: sharp and stabbing Frequency: intermittent at times more constant Radiation: none Aggravating factors: movement and bending Alleviating factors: nothing Status: fluctuating Treatments attempted: Flexeril, Flomax, Percocet Relief with NSAIDs?: No NSAIDs Taken Nighttime pain:  no Paresthesias / decreased sensation:  no Bowel / bladder incontinence:  no Fevers:  no Dysuria / urinary frequency:  no   Relevant past medical, surgical, family and social history reviewed and updated as indicated. Interim medical history since our last visit reviewed. Allergies and medications reviewed and updated.  Review of Systems  Constitutional:  Negative for activity change, diaphoresis, fatigue and fever.  Respiratory:  Negative for cough, chest tightness, shortness of breath and wheezing.   Cardiovascular:  Negative for chest pain, palpitations and leg  swelling.  Gastrointestinal: Negative.   Musculoskeletal:  Positive for back pain.  Neurological: Negative.   Psychiatric/Behavioral:  Negative for decreased concentration, self-injury, sleep disturbance and suicidal ideas. The patient is not nervous/anxious.     Per HPI unless specifically indicated above     Objective:    BP 127/87   Pulse 64   Temp 98 F (36.7 C) (Oral)   Ht 5' 7.99" (1.727 m)   Wt 180 lb 8 oz (81.9 kg)   SpO2 95%   BMI 27.45 kg/m   Wt Readings from Last 3 Encounters:  03/06/23 180 lb 8 oz (81.9 kg)  01/01/23 172 lb (78 kg)  12/06/22 182 lb 9.6 oz (82.8 kg)    Physical Exam Vitals and nursing note reviewed.  Constitutional:      General: He is awake. He is not in acute distress.    Appearance: He is well-developed and well-groomed. He is not ill-appearing or toxic-appearing.  HENT:     Head: Normocephalic.     Right Ear: Hearing and external ear normal.     Left Ear: Hearing and external ear normal.  Eyes:     General: Lids are normal.     Extraocular Movements: Extraocular movements intact.     Conjunctiva/sclera: Conjunctivae normal.  Neck:     Thyroid: No thyromegaly.     Vascular: No carotid bruit.  Cardiovascular:     Rate and Rhythm: Normal rate and regular rhythm.     Heart sounds: Normal heart sounds.  Pulmonary:  Effort: No accessory muscle usage or respiratory distress.     Breath sounds: Normal breath sounds.  Abdominal:     General: Bowel sounds are normal. There is no distension.     Palpations: Abdomen is soft.     Tenderness: There is no abdominal tenderness.  Musculoskeletal:     Cervical back: Full passive range of motion without pain.     Right lower leg: No edema.     Left lower leg: No edema.  Lymphadenopathy:     Cervical: No cervical adenopathy.  Skin:    General: Skin is warm.     Capillary Refill: Capillary refill takes less than 2 seconds.  Neurological:     Mental Status: He is alert and oriented to person,  place, and time.     Deep Tendon Reflexes: Reflexes are normal and symmetric.     Reflex Scores:      Brachioradialis reflexes are 2+ on the right side and 2+ on the left side.      Patellar reflexes are 2+ on the right side and 2+ on the left side. Psychiatric:        Attention and Perception: Attention normal.        Mood and Affect: Mood normal.        Speech: Speech normal.        Behavior: Behavior normal. Behavior is cooperative.        Thought Content: Thought content normal.    Results for orders placed or performed during the hospital encounter of 01/01/23  Surgical pathology  Result Value Ref Range   SURGICAL PATHOLOGY      SURGICAL PATHOLOGY CASE: [id number] PATIENT: Sharol Roussel Surgical Pathology Report     Specimen Submitted: A. Rectum polyp; cold snare  Clinical History: Screening colonoscopy      DIAGNOSIS: A.  RECTUM, "POLYP"; COLD SNARE: - HYPERPLASTIC POLYP. - NEGATIVE FOR DYSPLASIA AND MALIGNANCY.   GROSS DESCRIPTION: A. Labeled: Rectal polyp x 1 cold snare Received: Formalin Collection time: 8:06 AM on 01/01/2023 Placed into formalin time: 8:06 AM on 01/01/2023 Tissue fragment(s): Multiple Size: Aggregate, 1.5 x 0.5 x 0.2 cm Description: Received is a single fragment of tan soft tissue admixed with intestinal debris.  The ratio of soft tissue to intestinal debris is 40: 60. Entirely submitted in 1 cassette.  RB 01/01/2023  Final Diagnosis performed by Ranell Patrick, MD.   Electronically signed 01/02/2023 9:37:24AM The electronic signature indicates that the named Attending Pathologist has evaluated the specimen Technical component performed at [place],  [address] [place], [place] [address] Lab: [phone] Dir:  Schimke, MD, MMM  Professional component performed at [facility], [facility], [address] [place], [place], [place] [address] Lab: [phone] Dir: Beryle Quant, MD       Assessment & Plan:    Problem List Items Addressed This Visit       Other   Chronic pain syndrome - Primary    Followed by pain clinic at [facility].  Continue this collaboration, recent note reviewed.      Relevant Medications   cyclobenzaprine (FLEXERIL) 5 MG tablet   traZODone (DESYREL) 150 MG tablet   Chronic, continuous use of opioids    Followed by pain clinic at [facility].      History of kidney stones    Improved at this time, will continue Flomax and Flexeril at this time as offering benefit and history of kidney stones.  Send to urology as needed.  Insomnia    Chronic, ongoing.  Has underlying chronic pain.  Refills on Trazodone sent.  Recommend focus on sleep hygiene methods and: ?Maintain a regular sleep schedule, particularly a regular wake-up time in the morning ?Try not to force sleep ?Avoid caffeinated beverages after lunch ?Avoid alcohol near bedtime (eg, late afternoon and evening)  ?Avoid smoking or other nicotine intake, particularly during the evening ?Adjust the bedroom environment as needed to decrease stimuli (eg, reduce ambient light, turn off the television or radio) ?Avoid prolonged use of light-emitting screens (laptops, tablets, smartphones, ebooks) before bedtime  ?Resolve concerns or worries before bedtime ?Exercise regularly for at least 20 minutes, preferably more than four to five hours prior to bedtime  ?Avoid daytime naps, especially if they are longer than 20 to 30 minutes or occur late in the day         Follow up plan: Return in about 8 months (around 11/12/2023) for Annual physical due after 11/09/23.

## 2023-05-03 ENCOUNTER — Ambulatory Visit: Payer: BC Managed Care – PPO | Admitting: Family Medicine

## 2023-05-03 ENCOUNTER — Encounter: Payer: Self-pay | Admitting: Family Medicine

## 2023-05-03 VITALS — BP 137/85 | HR 80 | Temp 98.1°F | Wt 181.2 lb

## 2023-05-03 DIAGNOSIS — R3911 Hesitancy of micturition: Secondary | ICD-10-CM | POA: Diagnosis not present

## 2023-05-03 DIAGNOSIS — N401 Enlarged prostate with lower urinary tract symptoms: Secondary | ICD-10-CM | POA: Diagnosis not present

## 2023-05-03 DIAGNOSIS — R39198 Other difficulties with micturition: Secondary | ICD-10-CM

## 2023-05-03 LAB — URINALYSIS, ROUTINE W REFLEX MICROSCOPIC
Bilirubin, UA: NEGATIVE
Glucose, UA: NEGATIVE
Ketones, UA: NEGATIVE
Leukocytes,UA: NEGATIVE
Nitrite, UA: NEGATIVE
Protein,UA: NEGATIVE
RBC, UA: NEGATIVE
Specific Gravity, UA: 1.025 (ref 1.005–1.030)
Urobilinogen, Ur: 0.2 mg/dL (ref 0.2–1.0)
pH, UA: 5.5 (ref 5.0–7.5)

## 2023-05-03 MED ORDER — TAMSULOSIN HCL 0.4 MG PO CAPS: 0.8000 mg | ORAL_CAPSULE | Freq: Every day | ORAL | 1 refills | Status: DC

## 2023-05-03 NOTE — Progress Notes (Signed)
BP 137/85   Pulse 80   Temp 98.1 F (36.7 C) (Oral)   Wt 181 lb 3.2 oz (82.2 kg)   SpO2 96%   BMI 27.56 kg/m    Subjective:    Patient ID: William Herring, male    DOB: August 21, 1971, 52 y.o.   MRN: [id number]  HPI: William Herring is a 52 y.o. male  Chief Complaint  Patient presents with   Dysuria    Pt states he feels like he has to all the time but nothing comes out, lower back pain.   BPH BPH status: worse Satisfied with current treatment?: no Medication side effects: no Medication compliance: excellent compliance Duration: chronic Nocturia: 3-4x per night Urinary frequency:yes Incomplete voiding: yes Urgency: yes Weak urinary stream: yes Straining to start stream: yes Dysuria: yes Onset: gradual Severity: moderate   Relevant past medical, surgical, family and social history reviewed and updated as indicated. Interim medical history since our last visit reviewed. Allergies and medications reviewed and updated.  Review of Systems  Constitutional: Negative.   Respiratory: Negative.    Cardiovascular: Negative.   Genitourinary:  Positive for decreased urine volume, difficulty urinating, dysuria, frequency and urgency. Negative for enuresis, flank pain, genital sores, hematuria, penile discharge, penile pain, penile swelling, scrotal swelling and testicular pain.  Musculoskeletal: Negative.   Allergic/Immunologic: Negative.   Neurological: Negative.   Psychiatric/Behavioral: Negative.      Per HPI unless specifically indicated above     Objective:    BP 137/85   Pulse 80   Temp 98.1 F (36.7 C) (Oral)   Wt 181 lb 3.2 oz (82.2 kg)   SpO2 96%   BMI 27.56 kg/m   Wt Readings from Last 3 Encounters:  05/03/23 181 lb 3.2 oz (82.2 kg)  03/06/23 180 lb 8 oz (81.9 kg)  01/01/23 172 lb (78 kg)    Physical Exam Vitals and nursing note reviewed.  Constitutional:      General: He is not in acute distress.    Appearance: Normal appearance. He is normal  weight. He is not ill-appearing, toxic-appearing or diaphoretic.  HENT:     Head: Normocephalic and atraumatic.     Right Ear: External ear normal.     Left Ear: External ear normal.     Nose: Nose normal.     Mouth/Throat:     Mouth: Mucous membranes are moist.     Pharynx: Oropharynx is clear.  Eyes:     General: No scleral icterus.       Right eye: No discharge.        Left eye: No discharge.     Extraocular Movements: Extraocular movements intact.     Conjunctiva/sclera: Conjunctivae normal.     Pupils: Pupils are equal, round, and reactive to light.  Cardiovascular:     Rate and Rhythm: Normal rate and regular rhythm.     Pulses: Normal pulses.     Heart sounds: Normal heart sounds. No murmur heard.    No friction rub. No gallop.  Pulmonary:     Effort: Pulmonary effort is normal. No respiratory distress.     Breath sounds: Normal breath sounds. No stridor. No wheezing, rhonchi or rales.  Chest:     Chest wall: No tenderness.  Musculoskeletal:        General: Normal range of motion.     Cervical back: Normal range of motion and neck supple.  Skin:    General: Skin is warm and dry.  Capillary Refill: Capillary refill takes less than 2 seconds.     Coloration: Skin is not jaundiced or pale.     Findings: No bruising, erythema, lesion or rash.  Neurological:     General: No focal deficit present.     Mental Status: He is alert and oriented to person, place, and time. Mental status is at baseline.  Psychiatric:        Mood and Affect: Mood normal.        Behavior: Behavior normal.        Thought Content: Thought content normal.        Judgment: Judgment normal.     Results for orders placed or performed during the hospital encounter of 01/01/23  Surgical pathology  Result Value Ref Range   SURGICAL PATHOLOGY      SURGICAL PATHOLOGY CASE: [id number] PATIENT: Sharol Roussel Surgical Pathology Report     Specimen Submitted: A. Rectum polyp; cold  snare  Clinical History: Screening colonoscopy      DIAGNOSIS: A.  RECTUM, "POLYP"; COLD SNARE: - HYPERPLASTIC POLYP. - NEGATIVE FOR DYSPLASIA AND MALIGNANCY.   GROSS DESCRIPTION: A. Labeled: Rectal polyp x 1 cold snare Received: Formalin Collection time: 8:06 AM on 01/01/2023 Placed into formalin time: 8:06 AM on 01/01/2023 Tissue fragment(s): Multiple Size: Aggregate, 1.5 x 0.5 x 0.2 cm Description: Received is a single fragment of tan soft tissue admixed with intestinal debris.  The ratio of soft tissue to intestinal debris is 40: 60. Entirely submitted in 1 cassette.  RB 01/01/2023  Final Diagnosis performed by Ranell Patrick, MD.   Electronically signed 01/02/2023 9:37:24AM The electronic signature indicates that the named Attending Pathologist has evaluated the specimen Technical component performed at [facility],  [address] [place], [place] [address] Lab: [phone] Dir: Jolene Schimke, MD, MMM  Professional component performed at [facility], [facility], [address] [place], [place], [place] [address] Lab: [phone] Dir: Beryle Quant, MD       Assessment & Plan:   Problem List Items Addressed This Visit   None Visit Diagnoses     Benign prostatic hyperplasia with urinary hesitancy    -  Primary   Will increase his flomax to 0.8mg  and recheck 1 month. Call with any concerns.   Relevant Medications   tamsulosin (FLOMAX) 0.4 MG CAPS capsule   Difficulty urinating       + BPH   Relevant Orders   Urinalysis, Routine w reflex microscopic        Follow up plan: Return in about 4 weeks (around 05/31/2023) for with Jolene.

## 2023-05-21 DIAGNOSIS — M5416 Radiculopathy, lumbar region: Secondary | ICD-10-CM | POA: Diagnosis not present

## 2023-05-21 DIAGNOSIS — Z79899 Other long term (current) drug therapy: Secondary | ICD-10-CM | POA: Diagnosis not present

## 2023-05-21 DIAGNOSIS — M961 Postlaminectomy syndrome, not elsewhere classified: Secondary | ICD-10-CM | POA: Diagnosis not present

## 2023-05-21 DIAGNOSIS — Z5181 Encounter for therapeutic drug level monitoring: Secondary | ICD-10-CM | POA: Diagnosis not present

## 2023-05-21 DIAGNOSIS — G894 Chronic pain syndrome: Secondary | ICD-10-CM | POA: Diagnosis not present

## 2023-05-21 DIAGNOSIS — Z79891 Long term (current) use of opiate analgesic: Secondary | ICD-10-CM | POA: Diagnosis not present

## 2023-06-02 ENCOUNTER — Other Ambulatory Visit: Payer: Self-pay | Admitting: Family Medicine

## 2023-06-04 NOTE — Telephone Encounter (Signed)
Requested Prescriptions  Pending Prescriptions Disp Refills   tamsulosin (FLOMAX) 0.4 MG CAPS capsule [Pharmacy Med Name: TAMSULOSIN HCL 0.4 MG CAPSULE] 180 capsule 0    Sig: TAKE 2 CAPSULES BY MOUTH EVERY DAY     Urology: Alpha-Adrenergic Blocker Passed - 06/02/2023 11:32 AM      Passed - PSA in normal range and within 360 days    Prostate Specific Ag, Serum  Date Value Ref Range Status  11/08/2022 0.4 0.0 - 4.0 ng/mL Final    Comment:    Roche ECLIA methodology. According to the American Urological Association, Serum PSA should decrease and remain at undetectable levels after radical prostatectomy. The AUA defines biochemical recurrence as an initial PSA value 0.2 ng/mL or greater followed by a subsequent confirmatory PSA value 0.2 ng/mL or greater. Values obtained with different assay methods or kits cannot be used interchangeably. Results cannot be interpreted as absolute evidence of the presence or absence of malignant disease.          Passed - Last BP in normal range    BP Readings from Last 1 Encounters:  05/03/23 137/85         Passed - Valid encounter within last 12 months    Recent Outpatient Visits           1 month ago Benign prostatic hyperplasia with urinary hesitancy   [place] [facility], Megan P, DO   3 months ago Chronic pain syndrome   [place] [facility] [place], Dorie Rank, NP   6 months ago Flank pain   [place] [facility] [place], Loxley T, NP   6 months ago Depression, major, single episode, severe (HCC)   [place] [facility] [place], Corrie Dandy T, NP   1 year ago Depression, major, single episode, severe (HCC)   [place] [facility] [place], Dorie Rank, NP       Future Appointments             In 2 weeks [place], Dorie Rank, NP [place] [facility]   In 5 months [place], Dorie Rank, NP [place] [facility], [facility]

## 2023-06-16 NOTE — Patient Instructions (Signed)

## 2023-06-18 ENCOUNTER — Ambulatory Visit: Payer: BC Managed Care – PPO | Admitting: Nurse Practitioner

## 2023-06-18 ENCOUNTER — Encounter: Payer: Self-pay | Admitting: Nurse Practitioner

## 2023-06-18 VITALS — BP 134/82 | HR 76 | Temp 98.3°F | Wt 184.0 lb

## 2023-06-18 DIAGNOSIS — R109 Unspecified abdominal pain: Secondary | ICD-10-CM | POA: Insufficient documentation

## 2023-06-18 MED ORDER — TAMSULOSIN HCL 0.4 MG PO CAPS: 0.4000 mg | ORAL_CAPSULE | Freq: Every day | ORAL | Status: DC

## 2023-06-18 NOTE — Progress Notes (Signed)
BP 134/82   Pulse 76   Temp 98.3 F (36.8 C) (Oral)   Wt 184 lb (83.5 kg)   SpO2 96%   BMI 27.98 kg/m    Subjective:    Patient ID: William Herring, male    DOB: 11-Sep-1971, 52 y.o.   MRN: [id number]  HPI: William Herring is a 52 y.o. male  Chief Complaint  Patient presents with   Benign Prostatic Hypertrophy    Pt states he has only been taking 1 Tamsulosin per day.    ABDOMINAL PAIN Was seen on 05/03/23 for symptom issues and recommended he start 0.8 MG (two tablets) daily. He has not done this -- he does not like taking a lot of medication.  Was having pain in kidney at visit -- this has improved.  He has history of kidney stones, last around Christmas time.  He suspects that is what pain was.   Duration:weeks Status: better Treatments attempted: none Fever: no Nausea: no Vomiting: no Weight loss: no Decreased appetite: no Diarrhea: no Constipation: no Blood in stool: no Dysuria/urinary frequency: no Hematuria: no Recurrent NSAID use: no   Relevant past medical, surgical, family and social history reviewed and updated as indicated. Interim medical history since our last visit reviewed. Allergies and medications reviewed and updated.  Review of Systems  Constitutional:  Negative for activity change, diaphoresis, fatigue and fever.  Respiratory:  Negative for cough, chest tightness, shortness of breath and wheezing.   Cardiovascular:  Negative for chest pain, palpitations and leg swelling.  Gastrointestinal: Negative.   Neurological: Negative.   Psychiatric/Behavioral:  Negative for decreased concentration, self-injury, sleep disturbance and suicidal ideas. The patient is not nervous/anxious.     Per HPI unless specifically indicated above     Objective:    BP 134/82   Pulse 76   Temp 98.3 F (36.8 C) (Oral)   Wt 184 lb (83.5 kg)   SpO2 96%   BMI 27.98 kg/m   Wt Readings from Last 3 Encounters:  06/18/23 184 lb (83.5 kg)  05/03/23 181 lb 3.2 oz  (82.2 kg)  03/06/23 180 lb 8 oz (81.9 kg)    Physical Exam Vitals and nursing note reviewed.  Constitutional:      General: He is awake. He is not in acute distress.    Appearance: He is well-developed and well-groomed. He is not ill-appearing or toxic-appearing.  HENT:     Head: Normocephalic.     Right Ear: Hearing and external ear normal.     Left Ear: Hearing and external ear normal.  Eyes:     General: Lids are normal.     Extraocular Movements: Extraocular movements intact.     Conjunctiva/sclera: Conjunctivae normal.  Neck:     Thyroid: No thyromegaly.     Vascular: No carotid bruit.  Cardiovascular:     Rate and Rhythm: Normal rate and regular rhythm.     Heart sounds: Normal heart sounds.  Pulmonary:     Effort: No accessory muscle usage or respiratory distress.     Breath sounds: Normal breath sounds.  Abdominal:     General: Bowel sounds are normal. There is no distension.     Palpations: Abdomen is soft.     Tenderness: There is no abdominal tenderness.  Musculoskeletal:     Cervical back: Full passive range of motion without pain.     Right lower leg: No edema.     Left lower leg: No edema.  Lymphadenopathy:  Cervical: No cervical adenopathy.  Skin:    General: Skin is warm.     Capillary Refill: Capillary refill takes less than 2 seconds.  Neurological:     Mental Status: He is alert and oriented to person, place, and time.     Deep Tendon Reflexes: Reflexes are normal and symmetric.     Reflex Scores:      Brachioradialis reflexes are 2+ on the right side and 2+ on the left side.      Patellar reflexes are 2+ on the right side and 2+ on the left side. Psychiatric:        Attention and Perception: Attention normal.        Mood and Affect: Mood normal.        Speech: Speech normal.        Behavior: Behavior normal. Behavior is cooperative.        Thought Content: Thought content normal.    Results for orders placed or performed in visit on 05/03/23   Urinalysis, Routine w reflex microscopic  Result Value Ref Range   Specific Gravity, UA 1.025 1.005 - 1.030   pH, UA 5.5 5.0 - 7.5   Color, UA Yellow Yellow   Appearance Ur Clear Clear   Leukocytes,UA Negative Negative   Protein,UA Negative Negative/Trace   Glucose, UA Negative Negative   Ketones, UA Negative Negative   RBC, UA Negative Negative   Bilirubin, UA Negative Negative   Urobilinogen, Ur 0.2 0.2 - 1.0 mg/dL   Nitrite, UA Negative Negative   Microscopic Examination Comment       Assessment & Plan:   Problem List Items Addressed This Visit       Other   Acute flank pain - Primary    Acute and improved.  Continue Flomax daily as ordered.  Discussed importance of fluid intake and avoiding certain foods.        Follow up plan: Return for as scheduled in January for physical.

## 2023-06-18 NOTE — Assessment & Plan Note (Signed)
Acute and improved.  Continue Flomax daily as ordered.  Discussed importance of fluid intake and avoiding certain foods.

## 2023-08-31 DIAGNOSIS — Z79891 Long term (current) use of opiate analgesic: Secondary | ICD-10-CM | POA: Diagnosis not present

## 2023-08-31 DIAGNOSIS — G894 Chronic pain syndrome: Secondary | ICD-10-CM | POA: Diagnosis not present

## 2023-08-31 DIAGNOSIS — M961 Postlaminectomy syndrome, not elsewhere classified: Secondary | ICD-10-CM | POA: Diagnosis not present

## 2023-08-31 DIAGNOSIS — M5416 Radiculopathy, lumbar region: Secondary | ICD-10-CM | POA: Diagnosis not present

## 2023-10-07 ENCOUNTER — Other Ambulatory Visit: Payer: Self-pay | Admitting: Nurse Practitioner

## 2023-10-10 NOTE — Telephone Encounter (Signed)
Requested Prescriptions  Pending Prescriptions Disp Refills   tamsulosin (FLOMAX) 0.4 MG CAPS capsule [Pharmacy Med Name: TAMSULOSIN HCL 0.4 MG CAPSULE] 180 capsule 0    Sig: TAKE 2 CAPSULES BY MOUTH EVERY DAY     Urology: Alpha-Adrenergic Blocker Passed - 10/07/2023  9:04 AM      Passed - PSA in normal range and within 360 days    Prostate Specific Ag, Serum  Date Value Ref Range Status  11/08/2022 0.4 0.0 - 4.0 ng/mL Final    Comment:    Roche ECLIA methodology. According to the American Urological Association, Serum PSA should decrease and remain at undetectable levels after radical prostatectomy. The AUA defines biochemical recurrence as an initial PSA value 0.2 ng/mL or greater followed by a subsequent confirmatory PSA value 0.2 ng/mL or greater. Values obtained with different assay methods or kits cannot be used interchangeably. Results cannot be interpreted as absolute evidence of the presence or absence of malignant disease.          Passed - Last BP in normal range    BP Readings from Last 1 Encounters:  06/18/23 134/82         Passed - Valid encounter within last 12 months    Recent Outpatient Visits           3 months ago Acute flank pain   [place] [facility] [place], Geary T, NP   5 months ago Benign prostatic hyperplasia with urinary hesitancy   [facility] [place], Megan P, DO   7 months ago Chronic pain syndrome   [place] [facility] [place], Dorie Rank, NP   10 months ago Flank pain   [place] [facility] [place], [place] T, NP   11 months ago Depression, major, single episode, severe (HCC)   [place] [facility] [place], Dorie Rank, NP       Future Appointments             In 1 month [place], Dorie Rank, NP [place] [facility]

## 2023-11-09 NOTE — Patient Instructions (Signed)
 Managing Depression, Adult Depression is a mental health condition that affects your thoughts, feelings, and actions. Being diagnosed with depression can bring you relief if you did not know why you have felt or behaved a certain way. It could also leave you feeling overwhelmed. Finding ways to manage your symptoms can help you feel more positive about your future. How to manage lifestyle changes Being depressed is difficult. Depression can increase the level of everyday stress. Stress can make depression symptoms worse. You may believe your symptoms cannot be managed or will never improve. However, there are many things you can try to help manage your symptoms. There is hope. Managing stress  Stress is your body's reaction to life changes and events, both good and bad. Stress can add to your feelings of depression. Learning to manage your stress can help lessen your feelings of depression. Try some of the following approaches to reducing your stress (stress reduction techniques): Listen to music that you enjoy and that inspires you. Try using a meditation app or take a meditation class. Develop a practice that helps you connect with your spiritual self. Walk in nature, pray, or go to a place of worship. Practice deep breathing. To do this, inhale slowly through your nose. Pause at the top of your inhale for a few seconds and then exhale slowly, letting yourself relax. Repeat this three or four times. Practice yoga to help relax and work your muscles. Choose a stress reduction technique that works for you. These techniques take time and practice to develop. Set aside 5-15 minutes a day to do them. Therapists can offer training in these techniques. Do these things to help manage stress: Keep a journal. Know your limits. Set healthy boundaries for yourself and others, such as saying "no" when you think something is too much. Pay attention to how you react to certain situations. You may not be able to  control everything, but you can change your reaction. Add humor to your life by watching funny movies or shows. Make time for activities that you enjoy and that relax you. Spend less time using electronics, especially at night before bed. The light from screens can make your brain think it is time to get up rather than go to bed.  Medicines Medicines, such as antidepressants, are often a part of treatment for depression. Talk with your pharmacist or health care provider about all the medicines, supplements, and herbal products that you take, their possible side effects, and what medicines and other products are safe to take together. Make sure to report any side effects you may have to your health care provider. Relationships Your health care provider may suggest family therapy, couples therapy, or individual therapy as part of your treatment. How to recognize changes Everyone responds differently to treatment for depression. As you recover from depression, you may start to: Have more interest in doing activities. Feel more hopeful. Have more energy. Eat a more regular amount of food. Have better mental focus. It is important to recognize if your depression is not getting better or is getting worse. The symptoms you had in the beginning may return, such as: Feeling tired. Eating too much or too little. Sleeping too much or too little. Feeling restless, agitated, or hopeless. Trouble focusing or making decisions. Having unexplained aches and pains. Feeling irritable, angry, or aggressive. If you or your family members notice these symptoms coming back, let your health care provider know right away. Follow these instructions at home: Activity Try to  get some form of exercise each day, such as walking. Try yoga, mindfulness, or other stress reduction techniques. Participate in group activities if you are able. Lifestyle Get enough sleep. Cut down on or stop using caffeine, tobacco,  alcohol, and any other harmful substances. Eat a healthy diet that includes plenty of vegetables, fruits, whole grains, low-fat dairy products, and lean protein. Limit foods that are high in solid fats, added sugar, or salt (sodium). General instructions Take over-the-counter and prescription medicines only as told by your health care provider. Keep all follow-up visits. It is important for your health care provider to check on your mood, behavior, and medicines. Your health care provider may need to make changes to your treatment. Where to find support Talking to others  Friends and family members can be sources of support and guidance. Talk to trusted friends or family members about your condition. Explain your symptoms and let them know that you are working with a health care provider to treat your depression. Tell friends and family how they can help. Finances Find mental health providers that fit with your financial situation. Talk with your health care provider if you are worried about access to food, housing, or medicine. Call your insurance company to learn about your co-pays and prescription plan. Where to find more information You can find support in your area from: Anxiety and Depression Association of America (ADAA): adaa.org Mental Health America: mentalhealthamerica.net The [employer] on Mental Illness: nami.org Contact a health care provider if: You stop taking your antidepressant medicines, and you have any of these symptoms: Nausea. Headache. Light-headedness. Chills and body aches. Not being able to sleep (insomnia). You or your friends and family think your depression is getting worse. Get help right away if: You have thoughts of hurting yourself or others. Get help right away if you feel like you may hurt yourself or others, or have thoughts about taking your own life. Go to your nearest emergency room or: Call 911. Call the National Suicide Prevention Lifeline at  (215) 435-0408 or 988. This is open 24 hours a day. Text the Crisis Text Line at 318 581 7774. This information is not intended to replace advice given to you by your health care provider. Make sure you discuss any questions you have with your health care provider. Document Revised: 02/28/2022 Document Reviewed: 02/28/2022 Elsevier Patient Education  2024 [employer].

## 2023-11-13 ENCOUNTER — Encounter: Payer: Self-pay | Admitting: Nurse Practitioner

## 2023-11-13 ENCOUNTER — Ambulatory Visit (INDEPENDENT_AMBULATORY_CARE_PROVIDER_SITE_OTHER): Payer: BC Managed Care – PPO | Admitting: Nurse Practitioner

## 2023-11-13 VITALS — BP 129/85 | HR 81 | Temp 98.2°F | Ht 68.0 in | Wt 194.2 lb

## 2023-11-13 DIAGNOSIS — F419 Anxiety disorder, unspecified: Secondary | ICD-10-CM | POA: Diagnosis not present

## 2023-11-13 DIAGNOSIS — N400 Enlarged prostate without lower urinary tract symptoms: Secondary | ICD-10-CM

## 2023-11-13 DIAGNOSIS — F322 Major depressive disorder, single episode, severe without psychotic features: Secondary | ICD-10-CM

## 2023-11-13 DIAGNOSIS — F5104 Psychophysiologic insomnia: Secondary | ICD-10-CM

## 2023-11-13 DIAGNOSIS — Z114 Encounter for screening for human immunodeficiency virus [HIV]: Secondary | ICD-10-CM

## 2023-11-13 DIAGNOSIS — Z0000 Encounter for general adult medical examination without abnormal findings: Secondary | ICD-10-CM

## 2023-11-13 DIAGNOSIS — E781 Pure hyperglyceridemia: Secondary | ICD-10-CM | POA: Diagnosis not present

## 2023-11-13 DIAGNOSIS — G894 Chronic pain syndrome: Secondary | ICD-10-CM

## 2023-11-13 MED ORDER — TRAZODONE HCL 150 MG PO TABS: 150.0000 mg | ORAL_TABLET | Freq: Every day | ORAL | 4 refills | Status: AC

## 2023-11-13 NOTE — Assessment & Plan Note (Signed)
 Refer to depression plan of care.

## 2023-11-13 NOTE — Progress Notes (Signed)
 BP 129/85 (BP Location: Left Arm, Cuff Size: Normal)   Pulse 81   Temp 98.2 F (36.8 C) (Oral)   Ht 5' 8 (1.727 m)   Wt 194 lb 3.2 oz (88.1 kg)   SpO2 98%   BMI 29.53 kg/m    Subjective:    Patient ID: William Herring, male    DOB: 01-Nov-1971, 53 y.o.   MRN: [id number]  HPI: William Herring is a 53 y.o. male presenting on 11/13/2023 for comprehensive medical examination. Current medical complaints include: none  He currently lives with:family Interim Problems from his last visit: none  The 10-year ASCVD risk score (Arnett DK, et al., 2019) is: 5.9%   Values used to calculate the score:     Age: 77 years     Sex: Male     Is Non-Hispanic African American: No     Diabetic: No     Tobacco smoker: No     Systolic Blood Pressure: 129 mmHg     Is BP treated: No     HDL Cholesterol: 25 mg/dL     Total Cholesterol: 153 mg/dL   Follows with pain clinic at [facility], last visit 08/31/23, sciatic pain lower back. Takes Percocet + Gabapentin.  Stable on this regimen for years.  Is trying Tizanidine as needed.  DEPRESSION Went to psychiatry briefly after referral 08/05/21, [place].  He stopped attending after provider no showed on a visit.  He reports doing okay without medication.  Using Trazodone  for sleep, helps him get more sleep then prior. Mood status: stable Psychotherapy/counseling: none Depressed mood: occasional Anxious mood: occasional Anhedonia: no Significant weight loss or gain: no Insomnia: occasional Fatigue: no Feelings of worthlessness or guilt: no Impaired concentration/indecisiveness: no Suicidal ideations: no Hopelessness: no Crying spells: no    11/13/2023   11:08 AM 06/18/2023    4:12 PM 12/06/2022    1:13 PM 11/08/2022    1:21 PM 08/05/2021    3:55 PM  Depression screen PHQ 2/9  Decreased Interest 2 2 2 2 1   Down, Depressed, Hopeless 1 1 1 2 1   PHQ - 2 Score 3 3 3 4 2   Altered sleeping 2 1 1 1  0  Tired, decreased energy 2 3 3 2  0  Change  in appetite 0 0 1 1 1   Feeling bad or failure about yourself  1 1 1 1  0  Trouble concentrating 1 1 0 0 0  Moving slowly or fidgety/restless 0 1 1 1  0  Suicidal thoughts 0 0 0 0 0  PHQ-9 Score 9 10 10 10 3   Difficult doing work/chores Somewhat difficult Somewhat difficult Somewhat difficult Somewhat difficult Somewhat difficult       11/13/2023   11:09 AM 06/18/2023    4:13 PM 12/06/2022    1:13 PM 11/08/2022    1:21 PM  GAD 7 : Generalized Anxiety Score  Nervous, Anxious, on Edge 0 1 0 1  Control/stop worrying 0 1 1 1   Worry too much - different things 1 1 1 1   Trouble relaxing 0 0 0 0  Restless 0 0 0 0  Easily annoyed or irritable 1 1 1 1   Afraid - awful might happen 0 1 2 1   Total GAD 7 Score 2 5 5 5   Anxiety Difficulty Not difficult at all Not difficult at all Not difficult at all Somewhat difficult   Functional Status Survey: Is the patient deaf or have difficulty hearing?: No Does the patient have difficulty seeing,  even when wearing glasses/contacts?: No Does the patient have difficulty concentrating, remembering, or making decisions?: No Does the patient have difficulty walking or climbing stairs?: No Does the patient have difficulty dressing or bathing?: No Does the patient have difficulty doing errands alone such as visiting a doctor's office or shopping?: No     05/13/2020    2:42 PM 11/08/2022    1:21 PM 12/06/2022    1:12 PM 06/18/2023    4:12 PM 11/13/2023   11:08 AM  Fall Risk  Falls in the past year?  0 0 0 0  Was there an injury with Fall?  0 0 0 0  Fall Risk Category Calculator  0 0 0 0  Fall Risk Category (Retired)  Low     (RETIRED) Patient Fall Risk Level Low fall risk      Patient at Risk for Falls Due to  No Fall Risks No Fall Risks No Fall Risks No Fall Risks  Fall risk Follow up  Falls evaluation completed Falls evaluation completed Falls evaluation completed Falls evaluation completed     Past Medical History:  Past Medical History:  Diagnosis Date    Chronic pain syndrome    GERD (gastroesophageal reflux disease)    History of kidney stones     Surgical History:  Past Surgical History:  Procedure Laterality Date   COLONOSCOPY WITH PROPOFOL  N/A 01/01/2023   Procedure: COLONOSCOPY WITH BIOPSY;  Surgeon: Jinny Carmine, MD;  Location: [facility];  Service: Endoscopy;  Laterality: N/A;   HEMILAMINOTOMY LUMBAR SPINE     pt states he had this 5 to 6 years ago    HERNIA REPAIR  inguinal   LUMBAR DISC SURGERY     pt states he had surgery 5 or 6 years ago    POLYPECTOMY N/A 01/01/2023   Procedure: POLYPECTOMY;  Surgeon: Jinny Carmine, MD;  Location: [facility];  Service: Endoscopy;  Laterality: N/A;    Medications:  Current Outpatient Medications on File Prior to Visit  Medication Sig   gabapentin (NEURONTIN) 600 MG tablet TAKE 1 TABLET BY MOUTH 3 TIMES DAILY   naloxone (NARCAN) nasal spray 4 mg/0.1 mL Place 0.4 mg into the nose once.   oxyCODONE-acetaminophen  (PERCOCET) 10-325 MG tablet Take 1 tablet by mouth every 4 (four) hours as needed.   tamsulosin  (FLOMAX ) 0.4 MG CAPS capsule TAKE 2 CAPSULES BY MOUTH EVERY DAY   tiZANidine (ZANAFLEX) 4 MG tablet Take 4 mg by mouth 2 (two) times daily as needed for muscle spasms.   No current facility-administered medications on file prior to visit.    Allergies:  Allergies  Allergen Reactions   Latex Rash    Bandaids if on too long    Social History:  Social History   Socioeconomic History   Marital status: Married    Spouse name: Not on file   Number of children: Not on file   Years of education: Not on file   Highest education level: Not on file  Occupational History   Not on file  Tobacco Use   Smoking status: Former    Current packs/day: 0.00    Types: Cigarettes    Quit date: 2010    Years since quitting: 15.0   Smokeless tobacco: Former    Types: Associate Professor status: Never Used  Substance and Sexual Activity   Alcohol use: Not Currently     Comment: on occasion   Drug use: No   Sexual activity: Yes  Other Topics Concern   Not on file  Social History Narrative   Not on file   Social Drivers of Health   Financial Resource Strain: Low Risk  (11/13/2023)   Overall Financial Resource Strain (CARDIA)    Difficulty of Paying Living Expenses: Not hard at all  Food Insecurity: No Food Insecurity (11/13/2023)   Hunger Vital Sign    Worried About Running Out of Food in the Last Year: Never true    Ran Out of Food in the Last Year: Never true  Transportation Needs: No Transportation Needs (11/13/2023)   PRAPARE - Administrator, Civil Service (Medical): No    Lack of Transportation (Non-Medical): No  Physical Activity: Sufficiently Active (11/13/2023)   Exercise Vital Sign    Days of Exercise per Week: 5 days    Minutes of Exercise per Session: 30 min  Stress: No Stress Concern Present (11/13/2023)   [employer] of Occupational Health - Occupational Stress Questionnaire    Feeling of Stress : Not at all  Social Connections: Moderately Isolated (11/13/2023)   Social Connection and Isolation Panel [NHANES]    Frequency of Communication with Friends and Family: Twice a week    Frequency of Social Gatherings with Friends and Family: Twice a week    Attends Religious Services: Never    Database Administrator or Organizations: No    Attends Banker Meetings: Never    Marital Status: Married  Catering Manager Violence: Not At Risk (11/13/2023)   Humiliation, Afraid, Rape, and Kick questionnaire    Fear of Current or Ex-Partner: No    Emotionally Abused: No    Physically Abused: No    Sexually Abused: No   Social History   Tobacco Use  Smoking Status Former   Current packs/day: 0.00   Types: Cigarettes   Quit date: 2010   Years since quitting: 15.0  Smokeless Tobacco Former   Types: Chew   Social History   Substance and Sexual Activity  Alcohol Use Not Currently   Comment: on occasion    Family  History:  Family History  Problem Relation Age of Onset   Hypertension Mother    Mental illness Mother    Stroke Paternal Grandmother    Asthma Father     Past medical history, surgical history, medications, allergies, family history and social history reviewed with patient today and changes made to appropriate areas of the chart.   ROS All other ROS negative except what is listed above and in the HPI.      Objective:    BP 129/85 (BP Location: Left Arm, Cuff Size: Normal)   Pulse 81   Temp 98.2 F (36.8 C) (Oral)   Ht 5' 8 (1.727 m)   Wt 194 lb 3.2 oz (88.1 kg)   SpO2 98%   BMI 29.53 kg/m   Wt Readings from Last 3 Encounters:  11/13/23 194 lb 3.2 oz (88.1 kg)  06/18/23 184 lb (83.5 kg)  05/03/23 181 lb 3.2 oz (82.2 kg)     Physical Exam Vitals and nursing note reviewed.  Constitutional:      General: He is awake. He is not in acute distress.    Appearance: He is well-developed and well-groomed. He is not ill-appearing or toxic-appearing.  HENT:     Head: Normocephalic and atraumatic.     Right Ear: Hearing, tympanic membrane, ear canal and external ear normal. No drainage.     Left Ear: Hearing, tympanic membrane, ear  canal and external ear normal. No drainage.     Nose: Nose normal.     Mouth/Throat:     Pharynx: Uvula midline.  Eyes:     General: Lids are normal.        Right eye: No discharge.        Left eye: No discharge.     Extraocular Movements: Extraocular movements intact.     Conjunctiva/sclera: Conjunctivae normal.     Pupils: Pupils are equal, round, and reactive to light.     Visual Fields: Right eye visual fields normal and left eye visual fields normal.  Neck:     Thyroid : No thyromegaly.     Vascular: No carotid bruit or JVD.     Trachea: Trachea normal.  Cardiovascular:     Rate and Rhythm: Normal rate and regular rhythm.     Heart sounds: Normal heart sounds, S1 normal and S2 normal. No murmur heard.    No gallop.  Pulmonary:      Effort: Pulmonary effort is normal. No accessory muscle usage or respiratory distress.     Breath sounds: Normal breath sounds.  Abdominal:     General: Bowel sounds are normal.     Palpations: Abdomen is soft. There is no hepatomegaly or splenomegaly.     Tenderness: There is no abdominal tenderness.  Musculoskeletal:        General: Normal range of motion.     Cervical back: Normal range of motion and neck supple.     Right lower leg: No edema.     Left lower leg: No edema.  Lymphadenopathy:     Head:     Right side of head: No submental, submandibular, tonsillar, preauricular or posterior auricular adenopathy.     Left side of head: No submental, submandibular, tonsillar, preauricular or posterior auricular adenopathy.     Cervical: No cervical adenopathy.  Skin:    General: Skin is warm and dry.     Capillary Refill: Capillary refill takes less than 2 seconds.     Findings: No rash.  Neurological:     Mental Status: He is alert and oriented to person, place, and time.     Gait: Gait is intact.     Deep Tendon Reflexes: Reflexes are normal and symmetric.     Reflex Scores:      Brachioradialis reflexes are 2+ on the right side and 2+ on the left side.      Patellar reflexes are 2+ on the right side and 2+ on the left side. Psychiatric:        Attention and Perception: Attention normal.        Mood and Affect: Mood normal.        Speech: Speech normal.        Behavior: Behavior normal. Behavior is cooperative.        Thought Content: Thought content normal.        Cognition and Memory: Cognition normal.     Results for orders placed or performed in visit on 05/03/23  Urinalysis, Routine w reflex microscopic   Collection Time: 05/03/23  3:17 PM  Result Value Ref Range   Specific Gravity, UA 1.025 1.005 - 1.030   pH, UA 5.5 5.0 - 7.5   Color, UA Yellow Yellow   Appearance Ur Clear Clear   Leukocytes,UA Negative Negative   Protein,UA Negative Negative/Trace   Glucose, UA  Negative Negative   Ketones, UA Negative Negative   RBC, UA Negative Negative  Bilirubin, UA Negative Negative   Urobilinogen, Ur 0.2 0.2 - 1.0 mg/dL   Nitrite, UA Negative Negative   Microscopic Examination Comment       Assessment & Plan:   Problem List Items Addressed This Visit       Other   Anxiety   Refer to depression plan of care.      Relevant Medications   traZODone  (DESYREL ) 150 MG tablet   Other Relevant Orders   TSH   Chronic pain syndrome   Followed by pain clinic at [facility].  Continue this collaboration, recent note reviewed.      Relevant Medications   tiZANidine (ZANAFLEX) 4 MG tablet   traZODone  (DESYREL ) 150 MG tablet   Depression, major, single episode, severe (HCC) - Primary   Chronic, ongoing.  Denies SI/HI.  Has been resistant to multiple medications in past.  Could consider Trintellix  in future if worsening mood.  At this time he wishes to remain off medications.  May need return to psychiatry in future.      Relevant Medications   traZODone  (DESYREL ) 150 MG tablet   Other Relevant Orders   TSH   Hypertriglyceridemia   Ongoing on labs, continue diet focus and regular walking.  Check labs today.      Relevant Orders   Comprehensive metabolic panel   Lipid Panel w/o Chol/HDL Ratio   Insomnia   Chronic, ongoing.  Has underlying chronic pain.  Refills on Trazodone  sent.  Recommend focus on sleep hygiene methods and: ?Maintain a regular sleep schedule, particularly a regular wake-up time in the morning ?Try not to force sleep ?Avoid caffeinated beverages after lunch ?Avoid alcohol near bedtime (eg, late afternoon and evening)  ?Avoid smoking or other nicotine intake, particularly during the evening ?Adjust the bedroom environment as needed to decrease stimuli (eg, reduce ambient light, turn off the television or radio) ?Avoid prolonged use of light-emitting screens (laptops, tablets, smartphones, ebooks) before bedtime  ?Resolve concerns or  worries before bedtime ?Exercise regularly for at least 20 minutes, preferably more than four to five hours prior to bedtime  ?Avoid daytime naps, especially if they are longer than 20 to 30 minutes or occur late in the day       Other Visit Diagnoses       Benign prostatic hyperplasia without lower urinary tract symptoms       PSA on labs today.   Relevant Orders   PSA     Encounter for screening for HIV       HIV screening today per guidelines, discussed with patient.   Relevant Orders   HIV Antibody (routine testing w rflx)     Encounter for annual physical exam       Annual physical today with labs and health maintenance reviewed, discussed with patient.   Relevant Orders   CBC with Differential/Platelet       Discussed aspirin prophylaxis for myocardial infarction prevention and decision was it was not indicated  LABORATORY TESTING:  Health maintenance labs ordered today as discussed above.   The natural history of prostate cancer and ongoing controversy regarding screening and potential treatment outcomes of prostate cancer has been discussed with the patient. The meaning of a false positive PSA and a false negative PSA has been discussed. He indicates understanding of the limitations of this screening test and wishes to proceed with screening PSA testing.   IMMUNIZATIONS:   - Tdap: Tetanus vaccination status reviewed: refuses. - Influenza: refuses - Pneumovax: Not applicable -  Prevnar: Not applicable - Zostavax vaccine: refuses  SCREENING: - Colonoscopy: Up To Date -- due next 01/01/2033 Discussed with patient purpose of the colonoscopy is to detect colon cancer at curable precancerous or early stages   - AAA Screening: Not applicable  -Hearing Test: Not applicable  -Spirometry: Not applicable   PATIENT COUNSELING:    Sexuality: Discussed sexually transmitted diseases, partner selection, use of condoms, avoidance of unintended pregnancy  and contraceptive  alternatives.   Advised to avoid cigarette smoking.  I discussed with the patient that most people either abstain from alcohol or drink within safe limits (<=14/week and <=4 drinks/occasion for males, <=7/weeks and <= 3 drinks/occasion for females) and that the risk for alcohol disorders and other health effects rises proportionally with the number of drinks per week and how often a drinker exceeds daily limits.  Discussed cessation/primary prevention of drug use and availability of treatment for abuse.   Diet: Encouraged to adjust caloric intake to maintain  or achieve ideal body weight, to reduce intake of dietary saturated fat and total fat, to limit sodium intake by avoiding high sodium foods and not adding table salt, and to maintain adequate dietary potassium and calcium preferably from fresh fruits, vegetables, and low-fat dairy products.    Stressed the importance of regular exercise  Injury prevention: Discussed safety belts, safety helmets, smoke detector, smoking near bedding or upholstery.   Dental health: Discussed importance of regular tooth brushing, flossing, and dental visits.   Follow up plan: NEXT PREVENTATIVE PHYSICAL DUE IN 1 YEAR. Return in about 1 year (around 11/12/2024) for Annual Physical.

## 2023-11-13 NOTE — Assessment & Plan Note (Signed)
Chronic, ongoing.  Has underlying chronic pain.  Refills on Trazodone sent.  Recommend focus on sleep hygiene methods and: ?Maintain a regular sleep schedule, particularly a regular wake-up time in the morning ?Try not to force sleep ?Avoid caffeinated beverages after lunch ?Avoid alcohol near bedtime (eg, late afternoon and evening)  ?Avoid smoking or other nicotine intake, particularly during the evening ?Adjust the bedroom environment as needed to decrease stimuli (eg, reduce ambient light, turn off the television or radio) ?Avoid prolonged use of light-emitting screens (laptops, tablets, smartphones, ebooks) before bedtime  ?Resolve concerns or worries before bedtime ?Exercise regularly for at least 20 minutes, preferably more than four to five hours prior to bedtime  ?Avoid daytime naps, especially if they are longer than 20 to 30 minutes or occur late in the day

## 2023-11-13 NOTE — Assessment & Plan Note (Signed)
Followed by pain clinic at [facility].  Continue this collaboration, recent note reviewed. 

## 2023-11-13 NOTE — Assessment & Plan Note (Signed)
 Ongoing on labs, continue diet focus and regular walking.  Check labs today.

## 2023-11-13 NOTE — Assessment & Plan Note (Signed)
 Chronic, ongoing.  Denies SI/HI.  Has been resistant to multiple medications in past.  Could consider Trintellix in future if worsening mood.  At this time he wishes to remain off medications.  May need return to psychiatry in future.

## 2023-11-14 DIAGNOSIS — G894 Chronic pain syndrome: Secondary | ICD-10-CM | POA: Diagnosis not present

## 2023-11-14 DIAGNOSIS — Z5181 Encounter for therapeutic drug level monitoring: Secondary | ICD-10-CM | POA: Diagnosis not present

## 2023-11-14 DIAGNOSIS — Z79891 Long term (current) use of opiate analgesic: Secondary | ICD-10-CM | POA: Diagnosis not present

## 2023-11-14 DIAGNOSIS — M961 Postlaminectomy syndrome, not elsewhere classified: Secondary | ICD-10-CM | POA: Diagnosis not present

## 2023-11-14 LAB — COMPREHENSIVE METABOLIC PANEL
ALT: 26 [IU]/L (ref 0–44)
AST: 21 [IU]/L (ref 0–40)
Albumin: 4.7 g/dL (ref 3.8–4.9)
Alkaline Phosphatase: 82 [IU]/L (ref 44–121)
BUN/Creatinine Ratio: 15 (ref 9–20)
BUN: 13 mg/dL (ref 6–24)
Bilirubin Total: 0.4 mg/dL (ref 0.0–1.2)
CO2: 26 mmol/L (ref 20–29)
Calcium: 9.6 mg/dL (ref 8.7–10.2)
Chloride: 102 mmol/L (ref 96–106)
Creatinine, Ser: 0.84 mg/dL (ref 0.76–1.27)
Globulin, Total: 2.2 g/dL (ref 1.5–4.5)
Glucose: 108 mg/dL — ABNORMAL HIGH (ref 70–99)
Potassium: 4.1 mmol/L (ref 3.5–5.2)
Sodium: 141 mmol/L (ref 134–144)
Total Protein: 6.9 g/dL (ref 6.0–8.5)
eGFR: 105 mL/min/{1.73_m2} (ref >59)

## 2023-11-14 LAB — CBC WITH DIFFERENTIAL/PLATELET
Basophils Absolute: 0 10*3/uL (ref 0.0–0.2)
Basos: 1 %
EOS (ABSOLUTE): 0.1 10*3/uL (ref 0.0–0.4)
Eos: 2 %
Hematocrit: 41.4 % (ref 37.5–51.0)
Hemoglobin: 14.1 g/dL (ref 13.0–17.7)
Immature Grans (Abs): 0 10*3/uL (ref 0.0–0.1)
Immature Granulocytes: 0 %
Lymphocytes Absolute: 3.3 10*3/uL — ABNORMAL HIGH (ref 0.7–3.1)
Lymphs: 52 %
MCH: 30.7 pg (ref 26.6–33.0)
MCHC: 34.1 g/dL (ref 31.5–35.7)
MCV: 90 fL (ref 79–97)
Monocytes Absolute: 0.3 10*3/uL (ref 0.1–0.9)
Monocytes: 5 %
Neutrophils Absolute: 2.6 10*3/uL (ref 1.4–7.0)
Neutrophils: 40 %
Platelets: 250 10*3/uL (ref 150–450)
RBC: 4.6 x10E6/uL (ref 4.14–5.80)
RDW: 12.4 % (ref 11.6–15.4)
WBC: 6.4 10*3/uL (ref 3.4–10.8)

## 2023-11-14 LAB — PSA: Prostate Specific Ag, Serum: 0.6 ng/mL (ref 0.0–4.0)

## 2023-11-14 LAB — LIPID PANEL W/O CHOL/HDL RATIO
Cholesterol, Total: 183 mg/dL (ref 100–199)
HDL: 31 mg/dL — ABNORMAL LOW (ref >39)
LDL Chol Calc (NIH): 105 mg/dL — ABNORMAL HIGH (ref 0–99)
Triglycerides: 276 mg/dL — ABNORMAL HIGH (ref 0–149)
VLDL Cholesterol Cal: 47 mg/dL — ABNORMAL HIGH (ref 5–40)

## 2023-11-14 LAB — TSH: TSH: 1.17 u[IU]/mL (ref 0.450–4.500)

## 2023-11-14 LAB — HIV ANTIBODY (ROUTINE TESTING W REFLEX): HIV Screen 4th Generation wRfx: NONREACTIVE

## 2023-11-14 NOTE — Progress Notes (Signed)
 Contacted via MyChart The 10-year ASCVD risk score (Arnett DK, et al., 2019) is: 6.1%   Values used to calculate the score:     Age: 53 years     Sex: Male     Is Non-Hispanic African American: No     Diabetic: No     Tobacco smoker: No     Systolic Blood Pressure: 129 mmHg     Is BP treated: No     HDL Cholesterol: 31 mg/dL     Total Cholesterol: 183 mg/dL    Good afternoon William Herring, overall labs remain stable.  Lipid panel continues to show some abnormal levels.  Your cholesterol is still high, but continued recommendations to make lifestyle changes. Your LDL is above normal. The LDL is the bad cholesterol. Over time and in combination with inflammation and other factors, this contributes to plaque which in turn may lead to stroke and/or heart attack down the road. Sometimes high LDL is primarily genetic, and people might be eating all the right foods but still have high numbers. Other times, there is room for improvement in one's diet and eating healthier can bring this number down and potentially reduce one's risk of heart attack and/or stroke. To reduce your LDL, Remember - more fruits and vegetables, more fish, and limit red meat and dairy products. More soy, nuts, beans, barley, lentils, oats and plant sterol ester enriched margarine instead of butter. I also encourage eliminating sugar and processed food. Remember, shop on the outside of the grocery store and visit your [employer]. We will monitor closely and start medication as needed.  Any questions? Keep being amazing!!  Thank you for allowing me to participate in your care.  I appreciate you. Kindest regards, William Herring

## 2024-02-05 DIAGNOSIS — M5416 Radiculopathy, lumbar region: Secondary | ICD-10-CM | POA: Diagnosis not present

## 2024-02-05 DIAGNOSIS — M961 Postlaminectomy syndrome, not elsewhere classified: Secondary | ICD-10-CM | POA: Diagnosis not present

## 2024-02-05 DIAGNOSIS — G894 Chronic pain syndrome: Secondary | ICD-10-CM | POA: Diagnosis not present

## 2024-02-05 DIAGNOSIS — Z79899 Other long term (current) drug therapy: Secondary | ICD-10-CM | POA: Diagnosis not present

## 2024-04-25 ENCOUNTER — Encounter: Payer: Self-pay | Admitting: Family Medicine

## 2024-04-25 ENCOUNTER — Ambulatory Visit: Admitting: Family Medicine

## 2024-04-25 VITALS — BP 151/85 | HR 82 | Temp 97.8°F | Ht 68.0 in | Wt 179.8 lb

## 2024-04-25 DIAGNOSIS — H9313 Tinnitus, bilateral: Secondary | ICD-10-CM | POA: Insufficient documentation

## 2024-04-25 DIAGNOSIS — R5382 Chronic fatigue, unspecified: Secondary | ICD-10-CM | POA: Diagnosis not present

## 2024-04-25 DIAGNOSIS — H6121 Impacted cerumen, right ear: Secondary | ICD-10-CM | POA: Diagnosis not present

## 2024-04-25 DIAGNOSIS — M545 Low back pain, unspecified: Secondary | ICD-10-CM | POA: Diagnosis not present

## 2024-04-25 DIAGNOSIS — M5116 Intervertebral disc disorders with radiculopathy, lumbar region: Secondary | ICD-10-CM

## 2024-04-25 DIAGNOSIS — G8929 Other chronic pain: Secondary | ICD-10-CM | POA: Diagnosis not present

## 2024-04-25 DIAGNOSIS — M25552 Pain in left hip: Secondary | ICD-10-CM | POA: Diagnosis not present

## 2024-04-25 NOTE — Progress Notes (Unsigned)
 BP (!) 151/85 (BP Location: Left Arm, Patient Position: Sitting, Cuff Size: Normal)   Pulse 82   Temp 97.8 F (36.6 C) (Oral)   Ht 5' 8 (1.727 m)   Wt 179 lb 12.8 oz (81.6 kg)   SpO2 99%   BMI 27.34 kg/m    Subjective:    Patient ID: William Herring, male    DOB: 07-10-1971, 53 y.o.   MRN: [id number]  HPI: William Herring is a 53 y.o. male who presents today for an acute visit.   Chief Complaint  Patient presents with  . Fatigue    Would like blood work   . Tinnitus    Patient states that he had tinnitus for years but has progressively gotten worse   . Hip Pain    Left side. Onset about  6 months to a year, Progressively gotten worse.    Has been having tinnitus for 8+ years. He notes that in the past month it has gotten significantly louder. He has an appointment already but it's not scheduled for another month.   Has been on opiates   Relevant past medical, surgical, family and social history reviewed and updated as indicated. Interim medical history since our last visit reviewed. Allergies and medications reviewed and updated.  Review of Systems  Per HPI unless specifically indicated above     Objective:    BP (!) 151/85 (BP Location: Left Arm, Patient Position: Sitting, Cuff Size: Normal)   Pulse 82   Temp 97.8 F (36.6 C) (Oral)   Ht 5' 8 (1.727 m)   Wt 179 lb 12.8 oz (81.6 kg)   SpO2 99%   BMI 27.34 kg/m   Wt Readings from Last 3 Encounters:  04/25/24 179 lb 12.8 oz (81.6 kg)  11/13/23 194 lb 3.2 oz (88.1 kg)  06/18/23 184 lb (83.5 kg)    Physical Exam  Results for orders placed or performed in visit on 11/13/23  CBC with Differential/Platelet   Collection Time: 11/13/23 11:36 AM  Result Value Ref Range   WBC 6.4 3.4 - 10.8 x10E3/uL   RBC 4.60 4.14 - 5.80 x10E6/uL   Hemoglobin 14.1 13.0 - 17.7 g/dL   Hematocrit 58.5 62.4 - 51.0 %   MCV 90 79 - 97 fL   MCH 30.7 26.6 - 33.0 pg   MCHC 34.1 31.5 - 35.7 g/dL   RDW 87.5 88.3 - 84.5 %    Platelets 250 150 - 450 x10E3/uL   Neutrophils 40 Not Estab. %   Lymphs 52 Not Estab. %   Monocytes 5 Not Estab. %   Eos 2 Not Estab. %   Basos 1 Not Estab. %   Neutrophils Absolute 2.6 1.4 - 7.0 x10E3/uL   Lymphocytes Absolute 3.3 (H) 0.7 - 3.1 x10E3/uL   Monocytes Absolute 0.3 0.1 - 0.9 x10E3/uL   EOS (ABSOLUTE) 0.1 0.0 - 0.4 x10E3/uL   Basophils Absolute 0.0 0.0 - 0.2 x10E3/uL   Immature Granulocytes 0 Not Estab. %   Immature Grans (Abs) 0.0 0.0 - 0.1 x10E3/uL  Comprehensive metabolic panel   Collection Time: 11/13/23 11:36 AM  Result Value Ref Range   Glucose 108 (H) 70 - 99 mg/dL   BUN 13 6 - 24 mg/dL   Creatinine, Ser 9.15 0.76 - 1.27 mg/dL   eGFR 894 >40 fO/fpw/8.26   BUN/Creatinine Ratio 15 9 - 20   Sodium 141 134 - 144 mmol/L   Potassium 4.1 3.5 - 5.2 mmol/L   Chloride 102 96 -  106 mmol/L   CO2 26 20 - 29 mmol/L   Calcium 9.6 8.7 - 10.2 mg/dL   Total Protein 6.9 6.0 - 8.5 g/dL   Albumin 4.7 3.8 - 4.9 g/dL   Globulin, Total 2.2 1.5 - 4.5 g/dL   Bilirubin Total 0.4 0.0 - 1.2 mg/dL   Alkaline Phosphatase 82 44 - 121 IU/L   AST 21 0 - 40 IU/L   ALT 26 0 - 44 IU/L  PSA   Collection Time: 11/13/23 11:36 AM  Result Value Ref Range   Prostate Specific Ag, Serum 0.6 0.0 - 4.0 ng/mL  TSH   Collection Time: 11/13/23 11:36 AM  Result Value Ref Range   TSH 1.170 0.450 - 4.500 uIU/mL  Lipid Panel w/o Chol/HDL Ratio   Collection Time: 11/13/23 11:36 AM  Result Value Ref Range   Cholesterol, Total 183 100 - 199 mg/dL   Triglycerides 723 (H) 0 - 149 mg/dL   HDL 31 (L) >60 mg/dL   VLDL Cholesterol Cal 47 (H) 5 - 40 mg/dL   LDL Chol Calc (NIH) 894 (H) 0 - 99 mg/dL  HIV Antibody (routine testing w rflx)   Collection Time: 11/13/23 11:36 AM  Result Value Ref Range   HIV Screen 4th Generation wRfx Non Reactive Non Reactive      Assessment & Plan:   Problem List Items Addressed This Visit   None    Follow up plan: No follow-ups on file.

## 2024-04-25 NOTE — Assessment & Plan Note (Signed)
 Has appointment with ENT in about a month. Keep appointment with them.

## 2024-04-26 LAB — CBC WITH DIFFERENTIAL/PLATELET
Basophils Absolute: 0 10*3/uL (ref 0.0–0.2)
Basos: 0 %
EOS (ABSOLUTE): 0.1 10*3/uL (ref 0.0–0.4)
Eos: 1 %
Hematocrit: 45.4 % (ref 37.5–51.0)
Hemoglobin: 15.3 g/dL (ref 13.0–17.7)
Immature Grans (Abs): 0 10*3/uL (ref 0.0–0.1)
Immature Granulocytes: 0 %
Lymphocytes Absolute: 3.8 10*3/uL — ABNORMAL HIGH (ref 0.7–3.1)
Lymphs: 55 %
MCH: 30.8 pg (ref 26.6–33.0)
MCHC: 33.7 g/dL (ref 31.5–35.7)
MCV: 92 fL (ref 79–97)
Monocytes Absolute: 0.5 10*3/uL (ref 0.1–0.9)
Monocytes: 7 %
Neutrophils Absolute: 2.6 10*3/uL (ref 1.4–7.0)
Neutrophils: 37 %
Platelets: 244 10*3/uL (ref 150–450)
RBC: 4.96 x10E6/uL (ref 4.14–5.80)
RDW: 12.5 % (ref 11.6–15.4)
WBC: 7 10*3/uL (ref 3.4–10.8)

## 2024-04-26 LAB — COMPREHENSIVE METABOLIC PANEL WITH GFR
ALT: 21 IU/L (ref 0–44)
AST: 22 IU/L (ref 0–40)
Albumin: 5.2 g/dL — ABNORMAL HIGH (ref 3.8–4.9)
Alkaline Phosphatase: 79 IU/L (ref 44–121)
BUN/Creatinine Ratio: 15 (ref 9–20)
BUN: 12 mg/dL (ref 6–24)
Bilirubin Total: 0.5 mg/dL (ref 0.0–1.2)
CO2: 21 mmol/L (ref 20–29)
Calcium: 9.7 mg/dL (ref 8.7–10.2)
Chloride: 100 mmol/L (ref 96–106)
Creatinine, Ser: 0.82 mg/dL (ref 0.76–1.27)
Globulin, Total: 2.4 g/dL (ref 1.5–4.5)
Glucose: 95 mg/dL (ref 70–99)
Potassium: 3.8 mmol/L (ref 3.5–5.2)
Sodium: 140 mmol/L (ref 134–144)
Total Protein: 7.6 g/dL (ref 6.0–8.5)
eGFR: 106 mL/min/{1.73_m2} (ref >59)

## 2024-04-26 LAB — CK: Total CK: 67 U/L (ref 41–331)

## 2024-04-27 NOTE — Assessment & Plan Note (Signed)
 Will get him x-rays, it has been many years since his last imaging. Ordered today. Encouraged follow up with his pain management doctor. May want to consider injections again. Call with any concerns.

## 2024-04-28 ENCOUNTER — Ambulatory Visit: Payer: Self-pay | Admitting: Family Medicine

## 2024-04-28 ENCOUNTER — Ambulatory Visit
Admission: RE | Admit: 2024-04-28 | Discharge: 2024-04-28 | Disposition: A | Discharge status: Home / Self Care | Source: Ambulatory Visit | Attending: Family Medicine | Admitting: Family Medicine

## 2024-04-28 DIAGNOSIS — I700 Atherosclerosis of aorta: Secondary | ICD-10-CM | POA: Diagnosis not present

## 2024-04-28 DIAGNOSIS — M5136 Other intervertebral disc degeneration, lumbar region with discogenic back pain only: Secondary | ICD-10-CM | POA: Diagnosis not present

## 2024-04-28 DIAGNOSIS — M25552 Pain in left hip: Secondary | ICD-10-CM | POA: Diagnosis not present

## 2024-04-28 DIAGNOSIS — M5116 Intervertebral disc disorders with radiculopathy, lumbar region: Secondary | ICD-10-CM | POA: Insufficient documentation

## 2024-04-28 DIAGNOSIS — M48061 Spinal stenosis, lumbar region without neurogenic claudication: Secondary | ICD-10-CM | POA: Diagnosis not present

## 2024-04-28 DIAGNOSIS — M549 Dorsalgia, unspecified: Secondary | ICD-10-CM | POA: Diagnosis not present

## 2024-05-05 DIAGNOSIS — Z79899 Other long term (current) drug therapy: Secondary | ICD-10-CM | POA: Diagnosis not present

## 2024-05-05 DIAGNOSIS — G894 Chronic pain syndrome: Secondary | ICD-10-CM | POA: Diagnosis not present

## 2024-05-05 DIAGNOSIS — M961 Postlaminectomy syndrome, not elsewhere classified: Secondary | ICD-10-CM | POA: Diagnosis not present

## 2024-05-05 DIAGNOSIS — M5416 Radiculopathy, lumbar region: Secondary | ICD-10-CM | POA: Diagnosis not present

## 2024-05-05 DIAGNOSIS — Z1331 Encounter for screening for depression: Secondary | ICD-10-CM | POA: Diagnosis not present

## 2024-05-29 ENCOUNTER — Ambulatory Visit: Payer: Self-pay

## 2024-05-29 NOTE — Telephone Encounter (Signed)
 FYI Only or Action Required?: FYI only for provider.  Patient was last seen in primary care on 04/25/2024 by Vicci Bouchard P, DO.  Called Nurse Triage reporting Headache.  Symptoms began several days ago.  Interventions attempted: Rest, hydration, or home remedies.  Symptoms are: gradually worsening.  Triage Disposition: See Physician Within 24 Hours  Patient/caregiver understands and will follow disposition?: Yes  Message from [place] sent at 05/29/2024 12:35 PM EDT  pt isnt feeling well. He's been having headaches , dehydration and sick on the stomach need appt asap.   Reason for Disposition  [1] MODERATE headache (e.g., interferes with normal activities) AND [2] present > 24 hours AND [3] unexplained  (Exceptions: Pain medicines not tried, typical migraine, or headache part of viral illness.)  Answer Assessment - Initial Assessment Questions 1. LOCATION: Where does it hurt?      Generalized  2. ONSET: When did the headache start? (e.g., minutes, hours, days)      Several days 3. PATTERN: Does the pain come and go, or has it been constant since it started?     Constant  4. SEVERITY: How bad is the pain? and What does it keep you from doing?  (e.g., Scale 1-10; mild, moderate, or severe)     moderate 5. RECURRENT SYMPTOM: Have you ever had headaches before? If Yes, ask: When was the last time? and What happened that time?      no 6. CAUSE: What do you think is causing the headache?     Opioid detox treatment  7. OTHER SYMPTOMS: Do you have any other symptoms? (e.g., fever, stiff neck, eye pain, sore throat, cold symptoms)     Nausea  Additional info; [place] 10 days ago for a opioid treatment that made him sick to his stomach, he received IV hydration in [place] prior to returning home.  Returned home two days ago with diarrhea that has cleared. Now experiencing nausea, headaches, feels dehydrated. No appointments available in office until mid August,  advised urgent care evaluation today.  Protocols used: [facility]

## 2024-05-30 ENCOUNTER — Ambulatory Visit
Admission: RE | Admit: 2024-05-30 | Discharge: 2024-05-30 | Disposition: A | Discharge status: Home / Self Care | Attending: Emergency Medicine | Admitting: Emergency Medicine

## 2024-05-30 ENCOUNTER — Other Ambulatory Visit: Payer: Self-pay

## 2024-05-30 VITALS — BP 128/87 | HR 84 | Temp 98.7°F | Resp 18

## 2024-05-30 DIAGNOSIS — U071 COVID-19: Secondary | ICD-10-CM | POA: Insufficient documentation

## 2024-05-30 LAB — RESP PANEL BY RT-PCR (FLU A&B, COVID) ARPGX2
Influenza A by PCR: NEGATIVE
Influenza B by PCR: NEGATIVE
SARS Coronavirus 2 by RT PCR: POSITIVE — AB

## 2024-05-30 MED ORDER — BENZONATATE 100 MG PO CAPS: 200.0000 mg | ORAL_CAPSULE | Freq: Three times a day (TID) | ORAL | 0 refills | Status: DC

## 2024-05-30 MED ORDER — AEROCHAMBER MV MISC: 2 refills | Status: DC

## 2024-05-30 MED ORDER — PROMETHAZINE-DM 6.25-15 MG/5ML PO SYRP: 5.0000 mL | ORAL_SOLUTION | Freq: Four times a day (QID) | ORAL | 0 refills | Status: DC | PRN

## 2024-05-30 MED ORDER — ALBUTEROL SULFATE HFA 108 (90 BASE) MCG/ACT IN AERS: 2.0000 | INHALATION_SPRAY | RESPIRATORY_TRACT | 0 refills | Status: DC | PRN

## 2024-05-30 MED ORDER — IPRATROPIUM BROMIDE 0.06 % NA SOLN: 2.0000 | Freq: Four times a day (QID) | NASAL | 12 refills | Status: DC

## 2024-05-30 NOTE — Discharge Instructions (Addendum)
 CDC guidelines state that you must wear a mask for the first 5 days of symptoms when you are around other people.  After 5 days you no longer need to mask as you are no longer considered infectious.  There is no longer need to quarantine unless you have a fever.  If you do have a fever then you need to quarantine until you have been fever free for 24 hours without taking Tylenol  and/or ibuprofen.  Use over-the-counter Tylenol  and/or ibuprofen according to the package instructions as needed for fever and pain.  Use the Atrovent nasal spray, 2 squirts up each nostril every 6 hours, as needed for nasal congestion and runny nose.  Use the Tessalon  Perles every 8 hours during the day as needed for cough.  Take them with a small sip of water .  You may experience numbness to the base of your tongue or metallic taste in her mouth, this is normal.  Use the Promethazine  DM cough syrup at bedtime as needed for cough and congestion.  Be mindful this medication will make you sleepy.  Use the albuterol  inhaler, with the spacer, and take 1 to 2 puffs every 4-6 hours as needed for shortness of breath or wheezing.  If you develop any worsening respiratory symptoms such as shortness of breath, shortness of breath at rest, feel as though you cannot catch your breath, you are unable to speak in full sentences, or, as a late sign, your lips begin turning blue you need to call 911 and go to the ER for evaluation.

## 2024-05-30 NOTE — ED Provider Notes (Signed)
 [facility]    CSN: [id number] Arrival date & time: 05/30/24  1644      History   Chief Complaint Chief Complaint  Patient presents with   Cough   Headache    HPI William Herring is a 53 y.o. male.   HPI  53 year old male with past medical history significant for chronic pain syndrome, GERD, and kidney stones presents for evaluation of 3 days worth of respiratory symptoms.  He reports that he recently returned from [place]  but he is unaware of any sick contacts.  He is reporting headache, nasal congestion with clear nasal discharge, fatigue, and a nonproductive cough.  He denies fever, ear pain, sore throat, shortness breath, or wheezing.  Past Medical History:  Diagnosis Date   Chronic pain syndrome    GERD (gastroesophageal reflux disease)    History of kidney stones     Patient Active Problem List   Diagnosis Date Noted   Tinnitus of both ears 04/25/2024   Hypertriglyceridemia 11/13/2023   Insomnia 03/06/2023   Polyp of colon 01/01/2023   History of kidney stones 11/08/2022   Depression, major, single episode, severe (HCC) 08/28/2017   GERD (gastroesophageal reflux disease) 08/28/2017   Chronic, continuous use of opioids 02/02/2017   Intervertebral disc disorder with radiculopathy of lumbar region 06/24/2015   Chronic pain syndrome 04/21/2015   Anxiety 04/21/2015   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   Lumbar radiculopathy 08/27/2012    Past Surgical History:  Procedure Laterality Date   COLONOSCOPY WITH PROPOFOL  N/A 01/01/2023   Procedure: COLONOSCOPY WITH BIOPSY;  Surgeon: Jinny Carmine, MD;  Location: [facility];  Service: Endoscopy;  Laterality: N/A;   HEMILAMINOTOMY LUMBAR SPINE     pt states he had this 5 to 6 years ago    HERNIA REPAIR  inguinal   LUMBAR DISC SURGERY     pt states he had surgery 5 or 6 years ago    POLYPECTOMY N/A 01/01/2023   Procedure: POLYPECTOMY;  Surgeon: Jinny Carmine, MD;  Location:  [facility];  Service: Endoscopy;  Laterality: N/A;       Home Medications    Prior to Admission medications   Medication Sig Start Date End Date Taking? Authorizing Provider  albuterol  (VENTOLIN  HFA) 108 (90 Base) MCG/ACT inhaler Inhale 2 puffs into the lungs every 4 (four) hours as needed. 05/30/24  Yes Bernardino Ditch, NP  benzonatate  (TESSALON ) 100 MG capsule Take 2 capsules (200 mg total) by mouth every 8 (eight) hours. 05/30/24  Yes Bernardino Ditch, NP  ipratropium (ATROVENT) 0.06 % nasal spray Place 2 sprays into both nostrils 4 (four) times daily. 05/30/24  Yes Bernardino Ditch, NP  oxyCODONE-acetaminophen  (PERCOCET) 10-325 MG tablet Take 1 tablet by mouth every 4 (four) hours as needed. 11/01/22  Yes [provider]  promethazine -dextromethorphan (PROMETHAZINE -DM) 6.25-15 MG/5ML syrup Take 5 mLs by mouth 4 (four) times daily as needed. 05/30/24  Yes Bernardino Ditch, NP  Spacer/Aero-Holding Chambers (AEROCHAMBER MV) inhaler Use as instructed 05/30/24  Yes Bernardino Ditch, NP  traZODone  (DESYREL ) 150 MG tablet Take 1 tablet (150 mg total) by mouth at bedtime. 11/13/23  Yes Cannady, Jolene T, NP  naloxone (NARCAN) nasal spray 4 mg/0.1 mL Place 0.4 mg into the nose once. 08/31/23   [provider]    Family History Family History  Problem Relation Age of Onset   Hypertension Mother    Mental illness Mother    Stroke Paternal Grandmother    Asthma Father  Social History Social History   Tobacco Use   Smoking status: Former    Current packs/day: 0.00    Types: Cigarettes    Quit date: 2010    Years since quitting: 15.5   Smokeless tobacco: Former    Types: Associate Professor status: Never Used  Substance Use Topics   Alcohol use: Not Currently    Comment: on occasion   Drug use: Yes    Comment: hemp and delta 8     Allergies   Latex   Review of Systems Review of Systems  Constitutional:  Positive for fatigue. Negative for fever.  HENT:   Positive for congestion and rhinorrhea. Negative for ear pain and sore throat.   Respiratory:  Positive for cough. Negative for shortness of breath and wheezing.   Neurological:  Positive for headaches.     Physical Exam Triage Vital Signs ED Triage Vitals  Encounter Vitals Group     BP      Girls Systolic BP Percentile      Girls Diastolic BP Percentile      Boys Systolic BP Percentile      Boys Diastolic BP Percentile      Pulse      Resp      Temp      Temp src      SpO2      Weight      Height      Head Circumference      Peak Flow      Pain Score      Pain Loc      Pain Education      Exclude from Growth Chart    No data found.  Updated Vital Signs BP 128/87 (BP Location: Right Arm)   Pulse 84   Temp 98.7 F (37.1 C) (Oral)   Resp 18   SpO2 95%   Visual Acuity Right Eye Distance:   Left Eye Distance:   Bilateral Distance:    Right Eye Near:   Left Eye Near:    Bilateral Near:     Physical Exam Vitals and nursing note reviewed.  Constitutional:      Appearance: Normal appearance. He is ill-appearing.  HENT:     Head: Normocephalic and atraumatic.     Right Ear: Tympanic membrane, ear canal and external ear normal. There is no impacted cerumen.     Left Ear: Tympanic membrane, ear canal and external ear normal. There is no impacted cerumen.     Nose: Congestion and rhinorrhea present.     Comments: The the mucosa is edematous and erythematous with clear discharge in both nares.    Mouth/Throat:     Mouth: Mucous membranes are moist.     Pharynx: Oropharynx is clear. No oropharyngeal exudate or posterior oropharyngeal erythema.  Cardiovascular:     Rate and Rhythm: Normal rate and regular rhythm.     Pulses: Normal pulses.     Heart sounds: Normal heart sounds. No murmur heard.    No friction rub. No gallop.  Pulmonary:     Effort: Pulmonary effort is normal.     Breath sounds: Normal breath sounds. No wheezing, rhonchi or rales.   Musculoskeletal:     Cervical back: Normal range of motion and neck supple. No tenderness.  Lymphadenopathy:     Cervical: No cervical adenopathy.  Skin:    General: Skin is warm and dry.     Capillary Refill: Capillary refill takes  less than 2 seconds.     Findings: No rash.  Neurological:     General: No focal deficit present.     Mental Status: He is alert and oriented to person, place, and time.      UC Treatments / Results  Labs (all labs ordered are listed, but only abnormal results are displayed) Labs Reviewed  RESP PANEL BY RT-PCR (FLU A&B, COVID) ARPGX2 - Abnormal; Notable for the following components:      Result Value   SARS Coronavirus 2 by RT PCR POSITIVE (*)    All other components within normal limits    EKG   Radiology No results found.  Procedures Procedures (including critical care time)  Medications Ordered in UC Medications - No data to display  Initial Impression / Assessment and Plan / UC Course  I have reviewed the triage vital signs and the nursing notes.  Pertinent labs & imaging results that were available during my care of the patient were reviewed by me and considered in my medical decision making (see chart for details).   Patient is a pleasant, though ill-appearing, 53 year old male presenting for evaluation of 3 days worth of respiratory symptoms as outlined HPI above.  He did recently return from a trip to [place] .  He denies any known sick contacts.  He is denying shortness of breath or wheezing though chest auscultation does reveal expiratory wheezing in the upper middle lung fields bilaterally.  No rales or rhonchi noted.  Remains with physical exam does reveal inflammation of the upper respiratory tract as evidenced by inflamed nasal mucosa with clear nasal discharge.  Differential diagnose include COVID, influenza, viral respiratory illness.  I will order a COVID and flu PCR.  Respiratory panel is positive for COVID.  I will  discharge patient with diagnosis of COVID-19.  I will prescribe Atrovent to his right upper nasal congestion and Tessalon  Perles (him cough syrup cough congestion.  I will also prescribe him a Medrol  inhaler and a spacer that he can use for short breath or wheezing   Final Clinical Impressions(s) / UC Diagnoses   Final diagnoses:  COVID-19     Discharge Instructions      CDC guidelines state that you must wear a mask for the first 5 days of symptoms when you are around other people.  After 5 days you no longer need to mask as you are no longer considered infectious.  There is no longer need to quarantine unless you have a fever.  If you do have a fever then you need to quarantine until you have been fever free for 24 hours without taking Tylenol  and/or ibuprofen.  Use over-the-counter Tylenol  and/or ibuprofen according to the package instructions as needed for fever and pain.  Use the Atrovent nasal spray, 2 squirts up each nostril every 6 hours, as needed for nasal congestion and runny nose.  Use the Tessalon  Perles every 8 hours during the day as needed for cough.  Take them with a small sip of water .  You may experience numbness to the base of your tongue or metallic taste in her mouth, this is normal.  Use the Promethazine  DM cough syrup at bedtime as needed for cough and congestion.  Be mindful this medication will make you sleepy.  Use the albuterol  inhaler, with the spacer, and take 1 to 2 puffs every 4-6 hours as needed for shortness of breath or wheezing.  If you develop any worsening respiratory symptoms such as shortness of breath,  shortness of breath at rest, feel as though you cannot catch your breath, you are unable to speak in full sentences, or, as a late sign, your lips begin turning blue you need to call 911 and go to the ER for evaluation.      ED Prescriptions     Medication Sig Dispense Auth. Provider   Spacer/Aero-Holding Chambers (AEROCHAMBER MV) inhaler Use as  instructed 1 each Bernardino Ditch, NP   albuterol  (VENTOLIN  HFA) 108 (90 Base) MCG/ACT inhaler Inhale 2 puffs into the lungs every 4 (four) hours as needed. 18 g Bernardino Ditch, NP   benzonatate  (TESSALON ) 100 MG capsule Take 2 capsules (200 mg total) by mouth every 8 (eight) hours. 21 capsule Bernardino Ditch, NP   ipratropium (ATROVENT) 0.06 % nasal spray Place 2 sprays into both nostrils 4 (four) times daily. 15 mL Bernardino Ditch, NP   promethazine -dextromethorphan (PROMETHAZINE -DM) 6.25-15 MG/5ML syrup Take 5 mLs by mouth 4 (four) times daily as needed. 118 mL Bernardino Ditch, NP      PDMP not reviewed this encounter.   Bernardino Ditch, NP 05/30/24 [phone]

## 2024-05-30 NOTE — ED Triage Notes (Signed)
 Pt c/o headache, cough, nasal congestion, and fatigue. Started about 3 days ago. Unsure if he has a fever.

## 2024-06-14 NOTE — Patient Instructions (Signed)

## 2024-06-20 ENCOUNTER — Encounter: Payer: Self-pay | Admitting: Nurse Practitioner

## 2024-06-20 ENCOUNTER — Ambulatory Visit: Admitting: Nurse Practitioner

## 2024-06-20 VITALS — BP 143/89 | HR 72 | Temp 98.3°F | Ht 68.0 in | Wt 172.4 lb

## 2024-06-20 DIAGNOSIS — F1190 Opioid use, unspecified, uncomplicated: Secondary | ICD-10-CM | POA: Diagnosis not present

## 2024-06-20 DIAGNOSIS — F322 Major depressive disorder, single episode, severe without psychotic features: Secondary | ICD-10-CM

## 2024-06-20 DIAGNOSIS — R5383 Other fatigue: Secondary | ICD-10-CM

## 2024-06-20 DIAGNOSIS — G894 Chronic pain syndrome: Secondary | ICD-10-CM | POA: Diagnosis not present

## 2024-06-20 NOTE — Assessment & Plan Note (Signed)
Followed by pain clinic at [facility]. 

## 2024-06-20 NOTE — Assessment & Plan Note (Addendum)
 Chronic, ongoing.  Denies SI/HI and reports a safety plan is present.  Has been resistant to multiple medications in past.  Could consider Trintellix  in future if worsening mood.  At this time he wishes to remain off medications and refuses any medications or referrals to psychiatry or therapy at this time.  Will check labs outpatient for fatigue.

## 2024-06-20 NOTE — Progress Notes (Signed)
 BP (!) 143/89 (BP Location: Left Arm, Cuff Size: Normal)   Pulse 72   Temp 98.3 F (36.8 C) (Oral)   Ht 5' 8 (1.727 m)   Wt 172 lb 6.4 oz (78.2 kg)   SpO2 97%   BMI 26.21 kg/m    Subjective:    Patient ID: William Herring, male    DOB: 07-Mar-1971, 53 y.o.   MRN: [id number]  HPI: William Herring is a 53 y.o. male  Chief Complaint  Patient presents with   Back Pain   Fatigue   BACK PAIN & FATIGUE Presents today for follow-up from visit 04/25/24. At that visit imaging and labs done.  Imaging did note left kidney stone, which is ongoing. Labs reassuring. Fatigue continue to be present. Feels like he has no energy and no motivation.  This has been present for years.  In past saw psychiatry, last visit was 05/05/21. Has tried multiple medications. Last saw pain management on 05/05/24.  Continues on Percocet for pain. He self stopped Gabapentin due to concerns about affects on memory.  His long term goal is to come off all pain medication, when he has tried pain returned.  He endorses frustration over chronic health issues Duration: months Mechanism of injury: no trauma Location: Left and low back Onset: gradual Severity: 3/10 Quality: dull, aching, and throbbing Frequency: intermittent Radiation: L leg below the knee Aggravating factors: lifting, movement, and bending Alleviating factors: Oxycodone Status: fluctuating Treatments attempted: none  Relief with NSAIDs?: No NSAIDs Taken Nighttime pain:  yes Paresthesias / decreased sensation:  yes Bowel / bladder incontinence:  no Fevers:  no Dysuria / urinary frequency:  no     06/20/2024   11:10 AM 11/13/2023   11:08 AM 06/18/2023    4:12 PM 12/06/2022    1:13 PM 11/08/2022    1:21 PM  Depression screen PHQ 2/9  Decreased Interest 2 2 2 2 2   Down, Depressed, Hopeless 2 1 1 1 2   PHQ - 2 Score 4 3 3 3 4   Altered sleeping 2 2 1 1 1   Tired, decreased energy 3 2 3 3 2   Change in appetite 1 0 0 1 1  Feeling bad or failure about  yourself  3 1 1 1 1   Trouble concentrating 1 1 1  0 0  Moving slowly or fidgety/restless 0 0 1 1 1   Suicidal thoughts 0 0 0 0 0  PHQ-9 Score 14 9 10 10 10   Difficult doing work/chores Very difficult Somewhat difficult Somewhat difficult Somewhat difficult Somewhat difficult       06/20/2024   11:10 AM 11/13/2023   11:09 AM 06/18/2023    4:13 PM 12/06/2022    1:13 PM  GAD 7 : Generalized Anxiety Score  Nervous, Anxious, on Edge 1 0 1 0  Control/stop worrying 1 0 1 1  Worry too much - different things 1 1 1 1   Trouble relaxing 0 0 0 0  Restless 0 0 0 0  Easily annoyed or irritable 1 1 1 1   Afraid - awful might happen 1 0 1 2  Total GAD 7 Score 5 2 5 5   Anxiety Difficulty Somewhat difficult Not difficult at all Not difficult at all Not difficult at all   Relevant past medical, surgical, family and social history reviewed and updated as indicated. Interim medical history since our last visit reviewed. Allergies and medications reviewed and updated.  Review of Systems  Constitutional:  Negative for activity change, diaphoresis, fatigue  and fever.  Respiratory:  Negative for cough, chest tightness, shortness of breath and wheezing.   Cardiovascular:  Negative for chest pain, palpitations and leg swelling.  Gastrointestinal: Negative.   Neurological: Negative.   Psychiatric/Behavioral:  Negative for decreased concentration, self-injury, sleep disturbance and suicidal ideas. The patient is not nervous/anxious.     Per HPI unless specifically indicated above     Objective:    BP (!) 143/89 (BP Location: Left Arm, Cuff Size: Normal)   Pulse 72   Temp 98.3 F (36.8 C) (Oral)   Ht 5' 8 (1.727 m)   Wt 172 lb 6.4 oz (78.2 kg)   SpO2 97%   BMI 26.21 kg/m   Wt Readings from Last 3 Encounters:  06/20/24 172 lb 6.4 oz (78.2 kg)  04/25/24 179 lb 12.8 oz (81.6 kg)  11/13/23 194 lb 3.2 oz (88.1 kg)    Physical Exam Vitals and nursing note reviewed.  Constitutional:      General: He is  awake. He is not in acute distress.    Appearance: He is well-developed and well-groomed. He is not ill-appearing or toxic-appearing.  HENT:     Head: Normocephalic and atraumatic.     Right Ear: Hearing, tympanic membrane, ear canal and external ear normal. No drainage.     Left Ear: Hearing, tympanic membrane, ear canal and external ear normal. No drainage.     Nose: Nose normal.     Mouth/Throat:     Pharynx: Uvula midline.  Eyes:     General: Lids are normal.        Right eye: No discharge.        Left eye: No discharge.     Extraocular Movements: Extraocular movements intact.     Conjunctiva/sclera: Conjunctivae normal.     Pupils: Pupils are equal, round, and reactive to light.     Visual Fields: Right eye visual fields normal and left eye visual fields normal.  Neck:     Thyroid: No thyromegaly.     Vascular: No carotid bruit or JVD.     Trachea: Trachea normal.  Cardiovascular:     Rate and Rhythm: Normal rate and regular rhythm.     Heart sounds: Normal heart sounds, S1 normal and S2 normal. No murmur heard.    No gallop.  Pulmonary:     Effort: Pulmonary effort is normal. No accessory muscle usage or respiratory distress.     Breath sounds: Normal breath sounds.  Abdominal:     General: Bowel sounds are normal.     Palpations: Abdomen is soft. There is no hepatomegaly or splenomegaly.     Tenderness: There is no abdominal tenderness.  Musculoskeletal:        General: Normal range of motion.     Cervical back: Normal range of motion and neck supple.     Right lower leg: No edema.     Left lower leg: No edema.  Lymphadenopathy:     Head:     Right side of head: No submental, submandibular, tonsillar, preauricular or posterior auricular adenopathy.     Left side of head: No submental, submandibular, tonsillar, preauricular or posterior auricular adenopathy.     Cervical: No cervical adenopathy.  Skin:    General: Skin is warm and dry.     Capillary Refill: Capillary  refill takes less than 2 seconds.     Findings: No rash.  Neurological:     Mental Status: He is alert and oriented to person, place,  and time.     Gait: Gait is intact.     Deep Tendon Reflexes: Reflexes are normal and symmetric.     Reflex Scores:      Brachioradialis reflexes are 2+ on the right side and 2+ on the left side.      Patellar reflexes are 2+ on the right side and 2+ on the left side. Psychiatric:        Attention and Perception: Attention normal.        Mood and Affect: Mood normal.        Speech: Speech normal.        Behavior: Behavior normal. Behavior is cooperative.        Thought Content: Thought content normal.        Cognition and Memory: Cognition normal.     Results for orders placed or performed during the hospital encounter of 05/30/24  Resp Panel by RT-PCR (Flu A&B, Covid) Anterior Nasal Swab   Collection Time: 05/30/24  5:24 PM   Specimen: Anterior Nasal Swab  Result Value Ref Range   SARS Coronavirus 2 by RT PCR POSITIVE (A) NEGATIVE   Influenza A by PCR NEGATIVE NEGATIVE   Influenza B by PCR NEGATIVE NEGATIVE      Assessment & Plan:   Problem List Items Addressed This Visit       Other   Depression, major, single episode, severe (HCC) - Primary   Chronic, ongoing.  Denies SI/HI and reports a safety plan is present.  Has been resistant to multiple medications in past.  Could consider Trintellix  in future if worsening mood.  At this time he wishes to remain off medications and refuses any medications or referrals to psychiatry or therapy at this time.  Will check labs outpatient for fatigue.      Chronic, continuous use of opioids   Followed by pain clinic at [facility].      Chronic pain syndrome   Followed by pain clinic at [facility].  Continue this collaboration, recent note reviewed.  He self stopped Gabapentin, his goal long term is to come off all medications.      Other Visit Diagnoses       Fatigue, unspecified type       Labs outpatient.    Relevant Orders   TSH   Testosterone , free, total([facility])   Vitamin B12        Follow up plan: Return if symptoms worsen or fail to improve, for needs outpatient ab visit.SABRA

## 2024-06-20 NOTE — Assessment & Plan Note (Signed)
 Followed by pain clinic at [facility].  Continue this collaboration, recent note reviewed.  He self stopped Gabapentin, his goal long term is to come off all medications.

## 2024-06-26 ENCOUNTER — Other Ambulatory Visit

## 2024-06-26 DIAGNOSIS — R5383 Other fatigue: Secondary | ICD-10-CM

## 2024-06-27 ENCOUNTER — Ambulatory Visit: Payer: Self-pay | Admitting: Nurse Practitioner

## 2024-06-27 DIAGNOSIS — R7989 Other specified abnormal findings of blood chemistry: Secondary | ICD-10-CM

## 2024-06-27 NOTE — Progress Notes (Signed)
 Scheduled

## 2024-06-27 NOTE — Progress Notes (Signed)
 Contacted via MyChart - needs repeat lab visit in 4 weeks outpatient please Good morning William Herring, your testosterone  returned and is on lower side, <300.  I would like to recheck this again in 4 weeks outpatient and if it continues to be low then I recommend a visit with urology to see about replacement therapy, especially with your fatigue.  Any questions?

## 2024-07-01 LAB — VITAMIN B12: Vitamin B-12: 517 pg/mL (ref 232–1245)

## 2024-07-01 LAB — TSH: TSH: 1.66 u[IU]/mL (ref 0.450–4.500)

## 2024-07-01 LAB — TESTOSTERONE, FREE, TOTAL, SHBG
Sex Hormone Binding: 39.8 nmol/L (ref 19.3–76.4)
Testosterone, Free: 1.6 pg/mL — ABNORMAL LOW (ref 7.2–24.0)
Testosterone: 215 ng/dL — ABNORMAL LOW (ref 264–916)

## 2024-07-24 DIAGNOSIS — Z5181 Encounter for therapeutic drug level monitoring: Secondary | ICD-10-CM | POA: Diagnosis not present

## 2024-07-24 DIAGNOSIS — M5416 Radiculopathy, lumbar region: Secondary | ICD-10-CM | POA: Diagnosis not present

## 2024-07-24 DIAGNOSIS — G894 Chronic pain syndrome: Secondary | ICD-10-CM | POA: Diagnosis not present

## 2024-07-24 DIAGNOSIS — M961 Postlaminectomy syndrome, not elsewhere classified: Secondary | ICD-10-CM | POA: Diagnosis not present

## 2024-07-24 DIAGNOSIS — Z79899 Other long term (current) drug therapy: Secondary | ICD-10-CM | POA: Diagnosis not present

## 2024-07-24 DIAGNOSIS — Z1331 Encounter for screening for depression: Secondary | ICD-10-CM | POA: Diagnosis not present

## 2024-07-24 DIAGNOSIS — Z79891 Long term (current) use of opiate analgesic: Secondary | ICD-10-CM | POA: Diagnosis not present

## 2024-07-25 ENCOUNTER — Other Ambulatory Visit

## 2024-07-25 DIAGNOSIS — R7989 Other specified abnormal findings of blood chemistry: Secondary | ICD-10-CM | POA: Diagnosis not present

## 2024-07-26 ENCOUNTER — Ambulatory Visit: Payer: Self-pay | Admitting: Nurse Practitioner

## 2024-07-26 DIAGNOSIS — R7989 Other specified abnormal findings of blood chemistry: Secondary | ICD-10-CM

## 2024-07-26 NOTE — Progress Notes (Signed)
 Please let William Herring know his testosterone  level is right at 300.  I would recommend a visit with urology based on his past low level and current level.  Does he want this referral placed? Let me know and I can place it if so. Any questions? Keep being stellar!!  Thank you for allowing me to participate in your care.  I appreciate you. Kindest regards, Siddhi Dornbush

## 2024-07-30 LAB — TESTOSTERONE,FREE AND TOTAL
Testosterone, Free: 2.8 pg/mL — AB (ref 7.2–24.0)
Testosterone: 300 ng/dL (ref 264–916)

## 2024-08-14 ENCOUNTER — Ambulatory Visit: Admitting: Urology

## 2024-09-22 ENCOUNTER — Ambulatory Visit: Admitting: Nurse Practitioner

## 2024-09-22 ENCOUNTER — Encounter: Payer: Self-pay | Admitting: Nurse Practitioner

## 2024-09-22 VITALS — BP 136/70 | HR 73 | Temp 98.4°F | Resp 17 | Ht 66.5 in | Wt 180.4 lb

## 2024-09-22 DIAGNOSIS — R351 Nocturia: Secondary | ICD-10-CM

## 2024-09-22 DIAGNOSIS — N200 Calculus of kidney: Secondary | ICD-10-CM | POA: Insufficient documentation

## 2024-09-22 DIAGNOSIS — N401 Enlarged prostate with lower urinary tract symptoms: Secondary | ICD-10-CM | POA: Diagnosis not present

## 2024-09-22 NOTE — Assessment & Plan Note (Signed)
 He reports last passed 2 months ago and since that time has had worsening symptoms, will place referral to urology per request. Offered UA in office today, he refused and wishes to wait until urology visit.

## 2024-09-22 NOTE — Progress Notes (Signed)
 BP 136/70 (BP Location: Left Arm, Patient Position: Sitting, Cuff Size: Normal)   Pulse 73   Temp 98.4 F (36.9 C) (Oral)   Resp 17   Ht 5' 6.5 (1.689 m)   Wt 180 lb 6.4 oz (81.8 kg)   SpO2 98%   BMI 28.68 kg/m    Subjective:    Patient ID: William Herring, male    DOB: Aug 22, 1971, 53 y.o.   MRN: [id number]  HPI: William Herring is a 53 y.o. male  Chief Complaint  Patient presents with   Flank Pain    Only passed on stone a few months. Since passing needing to use restroom a lot overnight, mouth watering, some possible kidney pain.    URINARY SYMPTOMS Presents today for flank pain. History of kidney stones, last passed 2 months ago. Since that times has been needing to use restroom a lot at night, occasional mouth watering, and continues to have flank pain.  Stream is weaker and strains to get stream to start.  Had leftover Flomax  at home and took for awhile, this did not help. Left side is where pain is the worst. Dysuria: no Urinary frequency: occasional during daytime and more at night Urgency: yes Small volume voids: small amounts at night, normal during the daytime hours Symptom severity: yes Urinary incontinence: yes Foul odor: no Hematuria: no Abdominal pain: no Back pain: no Suprapubic pain/pressure: a little bit Flank pain: yes Fever:  no Vomiting: no Status: the same Previous urinary tract infection: yes Recurrent urinary tract infection: no Sexual activity: not really History of sexually transmitted disease: not since teenage years Treatments attempted: none    Relevant past medical, surgical, family and social history reviewed and updated as indicated. Interim medical history since our last visit reviewed. Allergies and medications reviewed and updated.  Review of Systems  Constitutional:  Negative for activity change, diaphoresis, fatigue and fever.  Respiratory:  Negative for cough, chest tightness, shortness of breath and wheezing.    Cardiovascular:  Negative for chest pain, palpitations and leg swelling.  Gastrointestinal: Negative.   Genitourinary:  Positive for decreased urine volume, flank pain, frequency and urgency. Negative for dysuria and hematuria.  Musculoskeletal:  Positive for back pain.  Neurological: Negative.   Psychiatric/Behavioral:  Negative for decreased concentration, self-injury, sleep disturbance and suicidal ideas. The patient is not nervous/anxious.     Per HPI unless specifically indicated above     Objective:    BP 136/70 (BP Location: Left Arm, Patient Position: Sitting, Cuff Size: Normal)   Pulse 73   Temp 98.4 F (36.9 C) (Oral)   Resp 17   Ht 5' 6.5 (1.689 m)   Wt 180 lb 6.4 oz (81.8 kg)   SpO2 98%   BMI 28.68 kg/m   Wt Readings from Last 3 Encounters:  09/22/24 180 lb 6.4 oz (81.8 kg)  06/20/24 172 lb 6.4 oz (78.2 kg)  04/25/24 179 lb 12.8 oz (81.6 kg)    Physical Exam Vitals and nursing note reviewed.  Constitutional:      General: He is awake. He is not in acute distress.    Appearance: He is well-developed and well-groomed. He is not ill-appearing or toxic-appearing.  HENT:     Head: Normocephalic.     Right Ear: Hearing and external ear normal.     Left Ear: Hearing and external ear normal.  Eyes:     General: Lids are normal.     Extraocular Movements: Extraocular movements intact.  Conjunctiva/sclera: Conjunctivae normal.  Neck:     Thyroid: No thyromegaly.     Vascular: No carotid bruit.  Cardiovascular:     Rate and Rhythm: Normal rate and regular rhythm.     Heart sounds: Normal heart sounds.  Pulmonary:     Effort: No accessory muscle usage or respiratory distress.     Breath sounds: Normal breath sounds.  Abdominal:     General: Bowel sounds are normal. There is no distension.     Palpations: Abdomen is soft.     Tenderness: There is no abdominal tenderness. There is left CVA tenderness. There is no right CVA tenderness.  Musculoskeletal:      Cervical back: Full passive range of motion without pain.     Right lower leg: No edema.     Left lower leg: No edema.  Lymphadenopathy:     Cervical: No cervical adenopathy.  Skin:    General: Skin is warm.     Capillary Refill: Capillary refill takes less than 2 seconds.  Neurological:     Mental Status: He is alert and oriented to person, place, and time.     Deep Tendon Reflexes: Reflexes are normal and symmetric.     Reflex Scores:      Brachioradialis reflexes are 2+ on the right side and 2+ on the left side.      Patellar reflexes are 2+ on the right side and 2+ on the left side. Psychiatric:        Attention and Perception: Attention normal.        Mood and Affect: Mood normal.        Speech: Speech normal.        Behavior: Behavior normal. Behavior is cooperative.        Thought Content: Thought content normal.     Results for orders placed or performed in visit on 07/25/24  Testosterone ,Free and Total   Collection Time: 07/25/24 10:24 AM  Result Value Ref Range   Testosterone  300 264 - 916 ng/dL   Testosterone , Free 2.8 (L) 7.2 - 24.0 pg/mL      Assessment & Plan:   Problem List Items Addressed This Visit       Genitourinary   Kidney stones - Primary   He reports last passed 2 months ago and since that time has had worsening symptoms, will place referral to urology per request. Offered UA in office today, he refused and wishes to wait until urology visit.      Relevant Orders   Ambulatory referral to Urology     Other   Benign prostatic hyperplasia with nocturia   Ongoing for a couple months and not improving.  Offered prostate exam and UA in office today, he would prefer male provider for exam and refused UA today. Will get him into urology. He tried Flomax  that he had leftover with no benefit. PSA in January was reassuring. He prefers to defer any testing until he sees urology.      Relevant Orders   Ambulatory referral to Urology     Follow up  plan: Return if symptoms worsen or fail to improve.

## 2024-09-22 NOTE — Patient Instructions (Signed)

## 2024-09-22 NOTE — Assessment & Plan Note (Signed)
 Ongoing for a couple months and not improving.  Offered prostate exam and UA in office today, he would prefer male provider for exam and refused UA today. Will get him into urology. He tried Flomax  that he had leftover with no benefit. PSA in January was reassuring. He prefers to defer any testing until he sees urology.

## 2024-09-23 NOTE — Progress Notes (Unsigned)
 09/24/24 3:56 PM   William Herring [birth year] [id number]   HPI: 53 y.o. male here for initial evaluation of LUTS, nephrolithiasis   Hx of nephrolithiasis  - reported Left sided stone passage 2 months ago, visualized  - Lumbar XR (June 2025) - punctate left renal stones  - no renal colic or flank pain   - hx of distant nephrolithiasis, s/p multiple ESWL 10-15 years ago   Hx of LUTS x 2-3 mo  - acute on chronic progression, worsened after stone event 2 months ago  - nocturia, frequency, urgency. No dysuria  - had been prescribed prior Flomax , is not taking. Has medication at home  - unclear if true nocturia vs baseline insomnia with nighttime voiding   Hx of chronic pain syndrome, GERD, low back pain, depression, insomnia, anxiety    PMH: Past Medical History:  Diagnosis Date   Chronic pain syndrome    GERD (gastroesophageal reflux disease)    History of kidney stones     Surgical History: Past Surgical History:  Procedure Laterality Date   COLONOSCOPY WITH PROPOFOL  N/A 01/01/2023   Procedure: COLONOSCOPY WITH BIOPSY;  Surgeon: Jinny Carmine, MD;  Location: [facility];  Service: Endoscopy;  Laterality: N/A;   HEMILAMINOTOMY LUMBAR SPINE     pt states he had this 5 to 6 years ago    HERNIA REPAIR  inguinal   LUMBAR DISC SURGERY     pt states he had surgery 5 or 6 years ago    POLYPECTOMY N/A 01/01/2023   Procedure: POLYPECTOMY;  Surgeon: Jinny Carmine, MD;  Location: [facility];  Service: Endoscopy;  Laterality: N/A;    Family History: Family History  Problem Relation Age of Onset   Hypertension Mother    Mental illness Mother    Stroke Paternal Grandmother    Asthma Father     Social History:  reports that he quit smoking about 15 years ago. His smoking use included cigarettes. He has quit using smokeless tobacco.  His smokeless tobacco use included chew. He reports that he does not currently use alcohol. He reports that he does not currently  use drugs.      Physical Exam: BP 124/83 (BP Location: Left Arm, Patient Position: Sitting, Cuff Size: Large)   Pulse 82   Wt 175 lb (79.4 kg)   SpO2 99%   BMI 27.82 kg/m    Constitutional:  Alert and oriented, No acute distress. Cardiovascular: No clubbing, cyanosis, or edema. Respiratory: Normal respiratory effort, no increased work of breathing. GI: Nondistended Skin: No rashes, bruises or suspicious lesions. Neurologic: Grossly intact, no focal deficits, moving all 4 extremities. Psychiatric: Normal mood and affect.  Laboratory Data:     Component Ref Range & Units (hover) 10 mo ago 1 yr ago  Prostate Specific Ag, Serum 0.6 0.4 CM       Pertinent Imaging: I have personally viewed and interpreted the Lumbar XR (June 2025) - ~3-4 mm + punctate left renal stones. No obvious right sided stones.    Assessment & Plan:    Kidney stones Assessment & Plan: Reported Left sided stone passage ~2 months Lingering LUTS, no flank pain  Today we reviewed the basic tenets of kidney stone management and prevention. We reviewed five broad recommendations:   - Adequate fluid intake >=2.5 L/day (goal urine output >=2 L/day). - Maintenance of normal dietary calcium (1,000-1,200 mg/day), avoid excess calcium supplements - Reduction of sodium intake (<2 g/day) to lower urinary calcium excretion. - Moderation  of oxalate-rich foods (nuts, spinach, beets, chocolate, tea)  - Limit animal protein (meat, fish, poultry); promote balanced diet with fruits/vegetables.   - order KUB + RBUS, assess current anatomy, stone burden, exclude remaining obstructive stones, hydro, etc  - RTC once complete to review  Orders: -     DG Abd 1 View; Future -     US  RENAL; Future  Lower urinary tract symptoms (LUTS) Assessment & Plan: Acute on chronic LUTS, x2 mo since kidney stone passage   Possible exacerbation of underlying BPH, pelvic floor dysfunction, OAB Although cannot exclude ongoing stone  event vs residual ureteral/bladder inflammation   - UA today - restart home Flomax  supply  - will evaluate nephrolithiasis as above, if negative imaging - plan to workup standard LUTS, deeper dive into medical history, insomnia/anxiety, possible pelvic floor dsyfunction       William Skye, MD 09/24/2024  [facility] [address], [address] [place], [place] [address] [phone]

## 2024-09-23 NOTE — Assessment & Plan Note (Signed)
 Today we reviewed the basic tenets of kidney stone management and prevention. We reviewed five broad recommendations:   - Adequate fluid intake >=2.5 L/day (goal urine output >=2 L/day). - Maintenance of normal dietary calcium (1,000-1,200 mg/day), avoid excess calcium supplements - Reduction of sodium intake (<2 g/day) to lower urinary calcium excretion. - Moderation of oxalate-rich foods (nuts, spinach, beets, chocolate, tea)  - Limit animal protein (meat, fish, poultry); promote balanced diet with fruits/vegetables.

## 2024-09-24 ENCOUNTER — Ambulatory Visit
Admission: RE | Admit: 2024-09-24 | Discharge: 2024-09-24 | Disposition: A | Discharge status: Home / Self Care | Attending: Urology | Admitting: Urology

## 2024-09-24 ENCOUNTER — Other Ambulatory Visit: Payer: Self-pay

## 2024-09-24 ENCOUNTER — Ambulatory Visit: Admitting: Urology

## 2024-09-24 ENCOUNTER — Other Ambulatory Visit
Admission: RE | Admit: 2024-09-24 | Discharge: 2024-09-24 | Disposition: A | Discharge status: Home / Self Care | Source: Home / Self Care | Attending: Urology | Admitting: Urology

## 2024-09-24 ENCOUNTER — Ambulatory Visit
Admission: RE | Admit: 2024-09-24 | Discharge: 2024-09-24 | Disposition: A | Discharge status: Home / Self Care | Source: Ambulatory Visit | Attending: Urology | Admitting: Urology

## 2024-09-24 VITALS — BP 124/83 | HR 82 | Wt 175.0 lb

## 2024-09-24 DIAGNOSIS — N200 Calculus of kidney: Secondary | ICD-10-CM

## 2024-09-24 DIAGNOSIS — R399 Unspecified symptoms and signs involving the genitourinary system: Secondary | ICD-10-CM | POA: Diagnosis not present

## 2024-09-24 LAB — URINALYSIS, COMPLETE (UACMP) WITH MICROSCOPIC
Bacteria, UA: NONE SEEN
Bilirubin Urine: NEGATIVE
Glucose, UA: NEGATIVE mg/dL
Hgb urine dipstick: NEGATIVE
Ketones, ur: NEGATIVE mg/dL
Leukocytes,Ua: NEGATIVE
Nitrite: NEGATIVE
Protein, ur: NEGATIVE mg/dL
Specific Gravity, Urine: 1.02 (ref 1.005–1.030)
pH: 5 (ref 5.0–8.0)

## 2024-09-24 NOTE — Patient Instructions (Signed)
 Please Call [phone] to schedule renal ultrasound

## 2024-09-24 NOTE — Assessment & Plan Note (Signed)
 Acute on chronic LUTS, x2 mo since kidney stone passage   Possible exacerbation of underlying BPH, pelvic floor dysfunction, OAB Although cannot exclude ongoing stone event vs residual ureteral/bladder inflammation   - UA today - restart home Flomax  supply  - will evaluate nephrolithiasis as above, if negative imaging - plan to workup standard LUTS, deeper dive into medical history, insomnia/anxiety, possible pelvic floor dsyfunction

## 2024-09-29 ENCOUNTER — Ambulatory Visit
Admission: RE | Admit: 2024-09-29 | Discharge: 2024-09-29 | Disposition: A | Discharge status: Home / Self Care | Source: Ambulatory Visit | Attending: Urology | Admitting: Urology

## 2024-09-29 DIAGNOSIS — N200 Calculus of kidney: Secondary | ICD-10-CM | POA: Insufficient documentation

## 2024-10-14 NOTE — Progress Notes (Unsigned)
   10/15/2024 4:12 PM   William Herring [birth year] [id number]  Reason for visit: Follow up nephrolithiasis   HPI: 53 y.o. male, follow up with me today  Interval KUB/RBUS - ~8-9 mm left lower pole renal calculus  Prior HPI: Hx of nephrolithiasis  - reported Left sided stone passage 2 months ago, visualized  - Lumbar XR (June 2025) - punctate left renal stones  - no renal colic or flank pain   - hx of distant nephrolithiasis, s/p multiple ESWL 10-15 years ago    Hx of LUTS x 2-3 mo  - acute on chronic progression, worsened after stone event 2 months ago  - nocturia, frequency, urgency. No dysuria  - had been prescribed prior Flomax , is not taking. Has medication at home  - unclear if true nocturia vs baseline insomnia with nighttime voiding    Hx of chronic pain syndrome, GERD, low back pain, depression, insomnia, anxiety    Physical Exam: There were no vitals taken for this visit.   Constitutional:  Alert and oriented, No acute distress.  Laboratory Data: N/A  Pertinent Imaging: I have personally viewed and interpreted the KUB/RBUS - agree with RBUS read, likely ~8-64mm left lower pole stone, no hydronephrosis, otherwise normal appearing bilateral kidneys.    Assessment & Plan:    Kidney stones Assessment & Plan: Reported Left sided stone passage ~2 months KUB/RBUS (Nov 2025) - ~51mm LLP stone           William JONELLE Skye, MD  [facility] [address] [place], [place] [address] [phone]

## 2024-10-14 NOTE — Assessment & Plan Note (Signed)
 Reported Left sided stone passage ~2 months KUB/RBUS (Nov 2025) - ~89mm LLP stone

## 2024-10-15 ENCOUNTER — Ambulatory Visit: Admitting: Urology

## 2024-10-15 VITALS — BP 156/97 | HR 103 | Wt 180.0 lb

## 2024-10-15 DIAGNOSIS — N401 Enlarged prostate with lower urinary tract symptoms: Secondary | ICD-10-CM

## 2024-10-15 DIAGNOSIS — N200 Calculus of kidney: Secondary | ICD-10-CM | POA: Diagnosis not present

## 2024-10-15 DIAGNOSIS — R351 Nocturia: Secondary | ICD-10-CM | POA: Diagnosis not present

## 2024-10-15 NOTE — Assessment & Plan Note (Signed)
 On Flomax  0.8 mg nightly Drinks 2 cups of coffee daily Moderate increase in job-related stress of the last 6 months  Today we reviewed the physiology and common causes of male lower urinary tract symptoms (LUTS). Discussed potential etiologies including infectious, inflammatory, bladder-related, benign prostatic hyperplasia (BPH), and musculoskeletal/pelvic floor contributions. Reviewed the standard diagnostic workup (urinalysis, PVR, uroflow, prostate assessment, possible cystoscopy or imaging) and the spectrum of initial management strategies ranging from behavioral and lifestyle measures to pharmacologic therapy, with procedural options if indicated. All questions were addressed and the patient expressed understanding of the evaluation and treatment pathway.   - Leaning towards conservative and lifestyle modifications at this point.  He may have some degree of pelvic floor dysfunction as well.  He was more aligned with this approach -Recommend reduction in daily caffeine, transition to decaf coffee earlier in the day -Consider addition of a bladder antispasmodic in the future, consider pelvic floor physical therapy

## 2024-10-16 ENCOUNTER — Ambulatory Visit: Admitting: Urology

## 2024-10-16 DIAGNOSIS — G894 Chronic pain syndrome: Secondary | ICD-10-CM | POA: Diagnosis not present

## 2024-10-16 DIAGNOSIS — M961 Postlaminectomy syndrome, not elsewhere classified: Secondary | ICD-10-CM | POA: Diagnosis not present

## 2024-10-16 DIAGNOSIS — M461 Sacroiliitis, not elsewhere classified: Secondary | ICD-10-CM | POA: Diagnosis not present

## 2024-10-16 DIAGNOSIS — M5416 Radiculopathy, lumbar region: Secondary | ICD-10-CM | POA: Diagnosis not present

## 2025-10-15 ENCOUNTER — Ambulatory Visit: Admitting: Urology
